# Patient Record
Sex: Female | Born: 1960
Health system: Southern US, Community
[De-identification: ages and names within clinical notes are randomized; demographics above are authoritative.]

## PROBLEM LIST (undated history)

## (undated) DIAGNOSIS — I341 Nonrheumatic mitral (valve) prolapse: Secondary | ICD-10-CM

## (undated) DIAGNOSIS — I10 Essential (primary) hypertension: Secondary | ICD-10-CM

## (undated) DIAGNOSIS — M810 Age-related osteoporosis without current pathological fracture: Secondary | ICD-10-CM

## (undated) HISTORY — PX: BREAST BIOPSY: SHX20

## (undated) HISTORY — DX: Age-related osteoporosis without current pathological fracture: M81.0

## (undated) HISTORY — PX: REDUCTION MAMMAPLASTY: SUR839

---

## 2000-05-13 ENCOUNTER — Ambulatory Visit (HOSPITAL_COMMUNITY): Admission: RE | Admit: 2000-05-13 | Discharge: 2000-05-13 | Payer: Self-pay | Admitting: Gastroenterology

## 2001-09-08 ENCOUNTER — Encounter: Payer: Self-pay | Admitting: *Deleted

## 2001-09-08 ENCOUNTER — Encounter: Admission: RE | Admit: 2001-09-08 | Discharge: 2001-09-08 | Payer: Self-pay | Admitting: *Deleted

## 2003-04-04 ENCOUNTER — Encounter: Payer: Self-pay | Admitting: Family Medicine

## 2003-04-04 ENCOUNTER — Ambulatory Visit (HOSPITAL_COMMUNITY): Admission: RE | Admit: 2003-04-04 | Discharge: 2003-04-04 | Payer: Self-pay | Admitting: Family Medicine

## 2004-04-23 ENCOUNTER — Other Ambulatory Visit: Admission: RE | Admit: 2004-04-23 | Discharge: 2004-04-23 | Payer: Self-pay | Admitting: Family Medicine

## 2004-04-29 ENCOUNTER — Ambulatory Visit (HOSPITAL_COMMUNITY): Admission: RE | Admit: 2004-04-29 | Discharge: 2004-04-29 | Payer: Self-pay | Admitting: Family Medicine

## 2005-04-24 ENCOUNTER — Other Ambulatory Visit: Admission: RE | Admit: 2005-04-24 | Discharge: 2005-04-24 | Payer: Self-pay | Admitting: Family Medicine

## 2005-05-19 ENCOUNTER — Ambulatory Visit (HOSPITAL_COMMUNITY): Admission: RE | Admit: 2005-05-19 | Discharge: 2005-05-19 | Payer: Self-pay | Admitting: Family Medicine

## 2005-11-30 ENCOUNTER — Ambulatory Visit (HOSPITAL_COMMUNITY): Admission: RE | Admit: 2005-11-30 | Discharge: 2005-11-30 | Payer: Self-pay | Admitting: Gastroenterology

## 2005-11-30 ENCOUNTER — Encounter (INDEPENDENT_AMBULATORY_CARE_PROVIDER_SITE_OTHER): Payer: Self-pay | Admitting: Specialist

## 2006-06-16 ENCOUNTER — Other Ambulatory Visit: Admission: RE | Admit: 2006-06-16 | Discharge: 2006-06-16 | Payer: Self-pay | Admitting: Family Medicine

## 2006-09-09 ENCOUNTER — Ambulatory Visit (HOSPITAL_COMMUNITY): Admission: RE | Admit: 2006-09-09 | Discharge: 2006-09-09 | Payer: Self-pay | Admitting: Family Medicine

## 2007-07-26 ENCOUNTER — Other Ambulatory Visit: Admission: RE | Admit: 2007-07-26 | Discharge: 2007-07-26 | Payer: Self-pay | Admitting: Family Medicine

## 2007-09-12 ENCOUNTER — Encounter: Admission: RE | Admit: 2007-09-12 | Discharge: 2007-09-12 | Payer: Self-pay | Admitting: Family Medicine

## 2008-07-27 ENCOUNTER — Other Ambulatory Visit: Admission: RE | Admit: 2008-07-27 | Discharge: 2008-07-27 | Payer: Self-pay | Admitting: Family Medicine

## 2008-12-05 ENCOUNTER — Ambulatory Visit (HOSPITAL_COMMUNITY): Admission: RE | Admit: 2008-12-05 | Discharge: 2008-12-05 | Payer: Self-pay | Admitting: Family Medicine

## 2009-07-29 ENCOUNTER — Other Ambulatory Visit: Admission: RE | Admit: 2009-07-29 | Discharge: 2009-07-29 | Payer: Self-pay | Admitting: Family Medicine

## 2010-01-01 ENCOUNTER — Ambulatory Visit (HOSPITAL_COMMUNITY): Admission: RE | Admit: 2010-01-01 | Discharge: 2010-01-01 | Payer: Self-pay | Admitting: Family Medicine

## 2010-11-23 ENCOUNTER — Encounter: Payer: Self-pay | Admitting: Family Medicine

## 2011-01-20 ENCOUNTER — Other Ambulatory Visit: Payer: Self-pay | Admitting: Gastroenterology

## 2011-03-20 NOTE — Op Note (Signed)
NAMEMAKAYAH, [name]                   ACCOUNT NO.:  [id number]   MEDICAL RECORD NO.:  [id number]          PATIENT TYPE:  AMB   LOCATION:  ENDO                         FACILITY:  [facility]   PHYSICIAN:  Bernette Redbird, M.D.   DATE OF BIRTH:  Jun 29, 1961   DATE OF PROCEDURE:  11/30/2005  DATE OF DISCHARGE:                                 OPERATIVE REPORT   PROCEDURE:  Colonoscopy with biopsies.   INDICATIONS:  A 50 year old female with a family history of colon cancer in  her father and also in an uncle. She had a negative colonoscopy by me  approximately 5 1/2 years ago.   FINDINGS:  Normal exam to the terminal ileum.   DESCRIPTION OF PROCEDURE:  The nature, purpose, and risks of the procedure  are familiar to the patient from prior examination and she provided written  consent. Sedation was fentanyl 75 mcg and Versed 8 mg IV without arrhythmias  or desaturation. The Olympus adjustable tension pediatric video colonoscope  was advanced with mild difficulty through a somewhat fixated and angulated  sigmoid region, with advancement made easier by placing the patient in the  supine position and using some irrigation with water to help open up the  sigmoid region. The scope was then advanced to the terminal ileum which had  a normal appearance and pullback was then performed. The quality of the prep  was very good and it is felt that all areas are well seen.   This was a normal examination. No polyps, cancer, colitis, vascular  malformations or diverticulosis were noted. Retroflexion in the rectum was  normal.   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   Retroflexion the rectum and reinspection of the rectum were unremarkable.   IMPRESSION:  1.  Altered bowel habit without obvious source on current examination      (787.99)  2.  Family  history of colon cancer.   PLAN:  Await pathology results. Repeat colonoscopy in 5 years in view of the  family history.           ______________________________  Bernette Redbird, M.D.     RB/MEDQ  D:  11/30/2005  T:  11/30/2005  Job:  [id number]   cc:   Jethro Bastos, M.D.  Fax: [phone]

## 2011-03-20 NOTE — Procedures (Signed)
[place]  Patient:    [name], [name]                          MRN: [id number] Proc. Date: 05/13/00 Adm. Date:  [id number] Attending:  Rich Brave                           Procedure Report  PROCEDURE:  Colonoscopy.  INDICATIONS:  A 50 year old with family history of colon cancer, rare episodes of small volume hematochezia, and occasional small volume fecal incontinence with urgency of defecation.  FINDINGS:  Normal examination to the cecum.  DESCRIPTION OF PROCEDURE:  The nature, purpose, and risks of the procedure have been discussed with the patient who provided written consent.  Sedation was fentanyl 75 mcg and Versed 7 mg IV without arrhythmias or desaturation. The Olympus pediatric adjustable tension video colonoscope was advanced easily to the cecum as identified by visualization of the ileocecal valve and the appendiceal orifice in the absence of further lumen.  Pullback was then performed.  The quality of the prep was excellent and it is felt that all areas were well seen.  This was a normal examination.  There was no evidence of polyps, cancer, colitis, vascular malformations, or diverticular disease and retroflexion in the rectum was normal.  There were mild internal hemorrhoids seen during pullout through the anal canal.  No biopsies were obtained.  The patient tolerated the procedure well and there were no apparent complications.  IMPRESSION:  Normal colonoscopy.  It is presumed that the patients occasional small volume hematochezia comes from her internal hemorrhoids.  PLAN:  Consider flexible sigmoidoscopy in five years and full colonoscopy in 10 years in view of the family history of colon cancer. DD:  05/13/00 TD:  05/13/00 Job: [id number]

## 2011-03-23 ENCOUNTER — Other Ambulatory Visit (HOSPITAL_COMMUNITY): Payer: Self-pay | Admitting: Internal Medicine

## 2011-03-23 DIAGNOSIS — Z1231 Encounter for screening mammogram for malignant neoplasm of breast: Secondary | ICD-10-CM

## 2011-03-27 ENCOUNTER — Ambulatory Visit (HOSPITAL_COMMUNITY)
Admission: RE | Admit: 2011-03-27 | Discharge: 2011-03-27 | Disposition: A | Discharge status: Home / Self Care | Payer: BC Managed Care – PPO | Source: Ambulatory Visit | Attending: Internal Medicine | Admitting: Internal Medicine

## 2011-03-27 DIAGNOSIS — Z1231 Encounter for screening mammogram for malignant neoplasm of breast: Secondary | ICD-10-CM | POA: Insufficient documentation

## 2012-02-01 ENCOUNTER — Other Ambulatory Visit: Payer: Self-pay | Admitting: Internal Medicine

## 2012-02-01 ENCOUNTER — Other Ambulatory Visit (HOSPITAL_COMMUNITY)
Admission: RE | Admit: 2012-02-01 | Discharge: 2012-02-01 | Disposition: A | Discharge status: Home / Self Care | Payer: BC Managed Care – PPO | Source: Ambulatory Visit | Attending: Internal Medicine | Admitting: Internal Medicine

## 2012-02-01 DIAGNOSIS — Z1159 Encounter for screening for other viral diseases: Secondary | ICD-10-CM | POA: Insufficient documentation

## 2012-02-01 DIAGNOSIS — Z01419 Encounter for gynecological examination (general) (routine) without abnormal findings: Secondary | ICD-10-CM | POA: Insufficient documentation

## 2012-03-02 ENCOUNTER — Other Ambulatory Visit (HOSPITAL_COMMUNITY): Payer: Self-pay | Admitting: Internal Medicine

## 2012-03-02 DIAGNOSIS — Z1231 Encounter for screening mammogram for malignant neoplasm of breast: Secondary | ICD-10-CM

## 2012-03-31 ENCOUNTER — Ambulatory Visit (HOSPITAL_COMMUNITY): Payer: BC Managed Care – PPO

## 2012-06-06 ENCOUNTER — Ambulatory Visit (HOSPITAL_COMMUNITY)
Admission: RE | Admit: 2012-06-06 | Discharge: 2012-06-06 | Disposition: A | Discharge status: Home / Self Care | Payer: BC Managed Care – PPO | Source: Ambulatory Visit | Attending: Internal Medicine | Admitting: Internal Medicine

## 2012-06-06 DIAGNOSIS — Z1231 Encounter for screening mammogram for malignant neoplasm of breast: Secondary | ICD-10-CM

## 2013-06-06 ENCOUNTER — Other Ambulatory Visit (HOSPITAL_COMMUNITY): Payer: Self-pay | Admitting: Internal Medicine

## 2013-06-06 DIAGNOSIS — Z1231 Encounter for screening mammogram for malignant neoplasm of breast: Secondary | ICD-10-CM

## 2013-06-09 ENCOUNTER — Ambulatory Visit (HOSPITAL_COMMUNITY)
Admission: RE | Admit: 2013-06-09 | Discharge: 2013-06-09 | Disposition: A | Discharge status: Home / Self Care | Payer: BC Managed Care – PPO | Source: Ambulatory Visit | Attending: Internal Medicine | Admitting: Internal Medicine

## 2013-06-09 DIAGNOSIS — Z1231 Encounter for screening mammogram for malignant neoplasm of breast: Secondary | ICD-10-CM

## 2014-01-06 ENCOUNTER — Emergency Department (HOSPITAL_COMMUNITY): Payer: BC Managed Care – PPO

## 2014-01-06 ENCOUNTER — Encounter (HOSPITAL_COMMUNITY): Payer: Self-pay | Admitting: Anesthesiology

## 2014-01-06 ENCOUNTER — Inpatient Hospital Stay (HOSPITAL_COMMUNITY): Payer: BC Managed Care – PPO

## 2014-01-06 ENCOUNTER — Inpatient Hospital Stay (HOSPITAL_COMMUNITY)
Admission: EM | Admit: 2014-01-06 | Discharge: 2014-01-10 | DRG: 183 | Disposition: A | Discharge status: Home / Self Care | Payer: BC Managed Care – PPO | Attending: General Surgery | Admitting: General Surgery

## 2014-01-06 DIAGNOSIS — S42109A Fracture of unspecified part of scapula, unspecified shoulder, initial encounter for closed fracture: Secondary | ICD-10-CM | POA: Diagnosis present

## 2014-01-06 DIAGNOSIS — S42101A Fracture of unspecified part of scapula, right shoulder, initial encounter for closed fracture: Secondary | ICD-10-CM | POA: Diagnosis present

## 2014-01-06 DIAGNOSIS — S81812A Laceration without foreign body, left lower leg, initial encounter: Secondary | ICD-10-CM | POA: Diagnosis present

## 2014-01-06 DIAGNOSIS — S22009A Unspecified fracture of unspecified thoracic vertebra, initial encounter for closed fracture: Secondary | ICD-10-CM | POA: Diagnosis present

## 2014-01-06 DIAGNOSIS — F329 Major depressive disorder, single episode, unspecified: Secondary | ICD-10-CM | POA: Diagnosis present

## 2014-01-06 DIAGNOSIS — I10 Essential (primary) hypertension: Secondary | ICD-10-CM | POA: Diagnosis present

## 2014-01-06 DIAGNOSIS — F3289 Other specified depressive episodes: Secondary | ICD-10-CM | POA: Diagnosis present

## 2014-01-06 DIAGNOSIS — I059 Rheumatic mitral valve disease, unspecified: Secondary | ICD-10-CM | POA: Diagnosis present

## 2014-01-06 DIAGNOSIS — S81009A Unspecified open wound, unspecified knee, initial encounter: Secondary | ICD-10-CM | POA: Diagnosis present

## 2014-01-06 DIAGNOSIS — T797XXA Traumatic subcutaneous emphysema, initial encounter: Secondary | ICD-10-CM | POA: Diagnosis present

## 2014-01-06 DIAGNOSIS — S272XXA Traumatic hemopneumothorax, initial encounter: Secondary | ICD-10-CM | POA: Diagnosis present

## 2014-01-06 DIAGNOSIS — S81811A Laceration without foreign body, right lower leg, initial encounter: Secondary | ICD-10-CM | POA: Diagnosis present

## 2014-01-06 DIAGNOSIS — D62 Acute posthemorrhagic anemia: Secondary | ICD-10-CM | POA: Diagnosis not present

## 2014-01-06 DIAGNOSIS — S2249XA Multiple fractures of ribs, unspecified side, initial encounter for closed fracture: Secondary | ICD-10-CM | POA: Diagnosis present

## 2014-01-06 DIAGNOSIS — S270XXA Traumatic pneumothorax, initial encounter: Secondary | ICD-10-CM

## 2014-01-06 DIAGNOSIS — S81809A Unspecified open wound, unspecified lower leg, initial encounter: Secondary | ICD-10-CM

## 2014-01-06 DIAGNOSIS — S91009A Unspecified open wound, unspecified ankle, initial encounter: Secondary | ICD-10-CM | POA: Diagnosis present

## 2014-01-06 DIAGNOSIS — J939 Pneumothorax, unspecified: Secondary | ICD-10-CM

## 2014-01-06 HISTORY — PX: CHEST TUBE INSERTION: SHX231

## 2014-01-06 HISTORY — DX: Essential (primary) hypertension: I10

## 2014-01-06 HISTORY — DX: Nonrheumatic mitral (valve) prolapse: I34.1

## 2014-01-06 LAB — CDS SEROLOGY

## 2014-01-06 LAB — CBC
HEMATOCRIT: 39.4 % (ref 36.0–46.0)
Hemoglobin: 13.9 g/dL (ref 12.0–15.0)
MCH: 30.8 pg (ref 26.0–34.0)
MCHC: 35.3 g/dL (ref 30.0–36.0)
MCV: 87.2 fL (ref 78.0–100.0)
Platelets: 272 10*3/uL (ref 150–400)
RBC: 4.52 MIL/uL (ref 3.87–5.11)
RDW: 13.2 % (ref 11.5–15.5)
WBC: 8.3 10*3/uL (ref 4.0–10.5)

## 2014-01-06 LAB — COMPREHENSIVE METABOLIC PANEL
ALT: 18 U/L (ref 0–35)
AST: 28 U/L (ref 0–37)
Albumin: 4 g/dL (ref 3.5–5.2)
Alkaline Phosphatase: 90 U/L (ref 39–117)
BUN: 17 mg/dL (ref 6–23)
CHLORIDE: 94 meq/L — AB (ref 96–112)
CO2: 22 mEq/L (ref 19–32)
Calcium: 9.2 mg/dL (ref 8.4–10.5)
Creatinine, Ser: 0.59 mg/dL (ref 0.50–1.10)
GFR calc non Af Amer: >90 mL/min (ref >90)
GLUCOSE: 123 mg/dL — AB (ref 70–99)
POTASSIUM: 3.3 meq/L — AB (ref 3.7–5.3)
SODIUM: 133 meq/L — AB (ref 137–147)
Total Bilirubin: 0.3 mg/dL (ref 0.3–1.2)
Total Protein: 7.2 g/dL (ref 6.0–8.3)

## 2014-01-06 LAB — SAMPLE TO BLOOD BANK

## 2014-01-06 LAB — PROTIME-INR
INR: 0.92 (ref 0.00–1.49)
Prothrombin Time: 12.2 seconds (ref 11.6–15.2)

## 2014-01-06 MED ORDER — HYDROMORPHONE HCL PF 1 MG/ML IJ SOLN
0.5000 mg | INTRAMUSCULAR | Status: DC | PRN
  Administered 2014-01-06 – 2014-01-07 (×3): 0.5 mg via INTRAVENOUS
  Filled 2014-01-06 (×3): qty 1

## 2014-01-06 MED ORDER — HYDROMORPHONE HCL PF 1 MG/ML IJ SOLN
1.0000 mg | Freq: Once | INTRAMUSCULAR | Status: AC
  Administered 2014-01-06: 1 mg via INTRAVENOUS
  Filled 2014-01-06: qty 1

## 2014-01-06 MED ORDER — PANTOPRAZOLE SODIUM 40 MG PO TBEC: 40.0000 mg | DELAYED_RELEASE_TABLET | Freq: Every day | ORAL | Status: DC

## 2014-01-06 MED ORDER — ENOXAPARIN SODIUM 40 MG/0.4ML ~~LOC~~ SOLN
40.0000 mg | SUBCUTANEOUS | Status: DC
  Administered 2014-01-06 – 2014-01-09 (×4): 40 mg via SUBCUTANEOUS
  Filled 2014-01-06 (×6): qty 0.4

## 2014-01-06 MED ORDER — ONDANSETRON HCL 4 MG PO TABS
4.0000 mg | ORAL_TABLET | Freq: Four times a day (QID) | ORAL | Status: DC | PRN
  Administered 2014-01-08: 4 mg via ORAL
  Filled 2014-01-06: qty 1

## 2014-01-06 MED ORDER — PANTOPRAZOLE SODIUM 40 MG IV SOLR
40.0000 mg | Freq: Every day | INTRAVENOUS | Status: DC
  Administered 2014-01-07: 40 mg via INTRAVENOUS
  Filled 2014-01-06 (×2): qty 40

## 2014-01-06 MED ORDER — OXYCODONE HCL 5 MG PO TABS: 10.0000 mg | ORAL_TABLET | ORAL | Status: DC | PRN

## 2014-01-06 MED ORDER — FENTANYL CITRATE 0.05 MG/ML IJ SOLN
INTRAMUSCULAR | Status: AC
  Administered 2014-01-06: 50 ug via INTRAVENOUS
  Filled 2014-01-06: qty 2

## 2014-01-06 MED ORDER — SODIUM CHLORIDE 0.9 % IV SOLN
1000.0000 mL | INTRAVENOUS | Status: DC
  Administered 2014-01-06: 1000 mL via INTRAVENOUS

## 2014-01-06 MED ORDER — IOHEXOL 300 MG/ML  SOLN
100.0000 mL | Freq: Once | INTRAMUSCULAR | Status: AC | PRN
  Administered 2014-01-06: 100 mL via INTRAVENOUS

## 2014-01-06 MED ORDER — KCL IN DEXTROSE-NACL 20-5-0.9 MEQ/L-%-% IV SOLN
INTRAVENOUS | Status: DC
  Administered 2014-01-06 – 2014-01-08 (×3): via INTRAVENOUS
  Filled 2014-01-06 (×6): qty 1000

## 2014-01-06 MED ORDER — FENTANYL CITRATE 0.05 MG/ML IJ SOLN
50.0000 ug | Freq: Once | INTRAMUSCULAR | Status: AC
  Administered 2014-01-06: 50 ug via INTRAVENOUS
  Filled 2014-01-06: qty 2

## 2014-01-06 MED ORDER — TRIAMTERENE-HCTZ 37.5-25 MG PO TABS
1.0000 | ORAL_TABLET | Freq: Every day | ORAL | Status: DC
  Administered 2014-01-08 – 2014-01-10 (×3): 1 via ORAL
  Filled 2014-01-06 (×5): qty 1

## 2014-01-06 MED ORDER — FLUOXETINE HCL 10 MG PO CAPS
10.0000 mg | ORAL_CAPSULE | Freq: Every day | ORAL | Status: DC
  Administered 2014-01-08 – 2014-01-10 (×3): 10 mg via ORAL
  Filled 2014-01-06 (×5): qty 1

## 2014-01-06 MED ORDER — ONDANSETRON HCL 4 MG/2ML IJ SOLN
4.0000 mg | Freq: Four times a day (QID) | INTRAMUSCULAR | Status: DC | PRN
  Administered 2014-01-07 (×3): 4 mg via INTRAVENOUS
  Filled 2014-01-06 (×3): qty 2

## 2014-01-06 MED ORDER — BISACODYL 10 MG RE SUPP: 10.0000 mg | Freq: Every day | RECTAL | Status: DC | PRN

## 2014-01-06 MED ORDER — FLUOXETINE HCL 20 MG PO TABS
10.0000 mg | ORAL_TABLET | Freq: Every day | ORAL | Status: DC
  Filled 2014-01-06: qty 1

## 2014-01-06 MED ORDER — MIDAZOLAM HCL 2 MG/2ML IJ SOLN
2.0000 mg | Freq: Once | INTRAMUSCULAR | Status: AC
  Administered 2014-01-06: 2 mg via INTRAVENOUS
  Filled 2014-01-06: qty 2

## 2014-01-06 MED ORDER — SODIUM CHLORIDE 0.9 % IV SOLN
1000.0000 mL | Freq: Once | INTRAVENOUS | Status: AC
  Administered 2014-01-06: 1000 mL via INTRAVENOUS

## 2014-01-06 MED ORDER — HYDROMORPHONE HCL PF 1 MG/ML IJ SOLN
1.0000 mg | Freq: Once | INTRAMUSCULAR | Status: AC
  Administered 2014-01-06: 1 mg via INTRAVENOUS

## 2014-01-06 MED ORDER — DOCUSATE SODIUM 100 MG PO CAPS
100.0000 mg | ORAL_CAPSULE | Freq: Two times a day (BID) | ORAL | Status: DC
  Administered 2014-01-07 – 2014-01-10 (×6): 100 mg via ORAL
  Filled 2014-01-06 (×6): qty 1

## 2014-01-06 MED ORDER — FENTANYL CITRATE 0.05 MG/ML IJ SOLN
50.0000 ug | Freq: Once | INTRAMUSCULAR | Status: AC
  Administered 2014-01-06: 50 ug via INTRAVENOUS

## 2014-01-06 MED ORDER — OXYCODONE HCL 5 MG PO TABS
5.0000 mg | ORAL_TABLET | ORAL | Status: DC | PRN
  Filled 2014-01-06: qty 1

## 2014-01-06 MED ORDER — HYDROCODONE-ACETAMINOPHEN 5-325 MG PO TABS: 1.0000 | ORAL_TABLET | ORAL | Status: DC | PRN
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
## 2014-01-06 NOTE — Procedures (Signed)
Chest Tube Insertion Procedure Note  Indications:  Clinically significant Pneumothorax/Traumatic  Pre-operative Diagnosis: Pneumothorax  Post-operative Diagnosis: Pneumothorax  Procedure Details  Informed consent was obtained for the procedure, including sedation.  Risks of lung perforation, hemorrhage, arrhythmia, and adverse drug reaction were discussed.   After sterile skin prep, using standard technique, a 32 French tube was placed in the right lateral 6th rib space.  Findings: Escape of air and leakage of 50ccblood  Estimated Blood Loss:  less than 50 mL         Specimens:  None              Complications:  None; patient tolerated the procedure well.         Disposition: Remained stable in the trauma resuscitation room         Condition: stable  Attending Attestation: I performed the procedure.

## 2014-01-06 NOTE — ED Notes (Signed)
Dr. Colbert CoyerArrieta contacted regarding comment to check HCG prior to CT- on order he marked yes, however patient is post-menopausal and no HCG has been ordered. Per Dr. Colbert CoyerArrieta, we do not need to wait for HCG if patient is post-menopause.
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
## 2014-01-07 ENCOUNTER — Inpatient Hospital Stay (HOSPITAL_COMMUNITY): Payer: BC Managed Care – PPO

## 2014-01-07 DIAGNOSIS — S22009A Unspecified fracture of unspecified thoracic vertebra, initial encounter for closed fracture: Secondary | ICD-10-CM

## 2014-01-07 LAB — CBC
HCT: 33.2 % — ABNORMAL LOW (ref 36.0–46.0)
Hemoglobin: 11.6 g/dL — ABNORMAL LOW (ref 12.0–15.0)
MCH: 30.4 pg (ref 26.0–34.0)
MCHC: 34.9 g/dL (ref 30.0–36.0)
MCV: 87.1 fL (ref 78.0–100.0)
PLATELETS: 218 10*3/uL (ref 150–400)
RBC: 3.81 MIL/uL — ABNORMAL LOW (ref 3.87–5.11)
RDW: 13.3 % (ref 11.5–15.5)
WBC: 9.6 10*3/uL (ref 4.0–10.5)

## 2014-01-07 LAB — BASIC METABOLIC PANEL
BUN: 11 mg/dL (ref 6–23)
CO2: 25 mEq/L (ref 19–32)
Calcium: 8.6 mg/dL (ref 8.4–10.5)
Chloride: 95 mEq/L — ABNORMAL LOW (ref 96–112)
Creatinine, Ser: 0.46 mg/dL — ABNORMAL LOW (ref 0.50–1.10)
Glucose, Bld: 169 mg/dL — ABNORMAL HIGH (ref 70–99)
POTASSIUM: 3.9 meq/L (ref 3.7–5.3)
SODIUM: 132 meq/L — AB (ref 137–147)

## 2014-01-07 LAB — MRSA PCR SCREENING: MRSA by PCR: NEGATIVE

## 2014-01-07 MED ORDER — HYDROMORPHONE HCL PF 1 MG/ML IJ SOLN
0.5000 mg | INTRAMUSCULAR | Status: DC | PRN
  Administered 2014-01-07 – 2014-01-08 (×4): 1 mg via INTRAVENOUS
  Filled 2014-01-07 (×2): qty 1
  Filled 2014-01-07: qty 2
  Filled 2014-01-07: qty 1

## 2014-01-07 MED ORDER — KETOROLAC TROMETHAMINE 30 MG/ML IJ SOLN
30.0000 mg | Freq: Three times a day (TID) | INTRAMUSCULAR | Status: DC
  Administered 2014-01-07 – 2014-01-08 (×3): 30 mg via INTRAVENOUS
  Filled 2014-01-07 (×6): qty 1

## 2014-01-07 MED ORDER — PROMETHAZINE HCL 25 MG/ML IJ SOLN
12.5000 mg | Freq: Four times a day (QID) | INTRAMUSCULAR | Status: DC | PRN
  Administered 2014-01-07: 12.5 mg via INTRAVENOUS
  Filled 2014-01-07: qty 1

## 2014-01-07 MED ORDER — ACETAMINOPHEN 10 MG/ML IV SOLN
1000.0000 mg | Freq: Four times a day (QID) | INTRAVENOUS | Status: AC
  Administered 2014-01-07 – 2014-01-08 (×4): 1000 mg via INTRAVENOUS
  Filled 2014-01-07 (×6): qty 100

## 2014-01-07 MED ORDER — GABAPENTIN 100 MG PO CAPS
100.0000 mg | ORAL_CAPSULE | Freq: Three times a day (TID) | ORAL | Status: DC
  Administered 2014-01-07: 100 mg via ORAL
  Filled 2014-01-07 (×5): qty 1
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
## 2014-01-08 ENCOUNTER — Inpatient Hospital Stay (HOSPITAL_COMMUNITY): Payer: BC Managed Care – PPO

## 2014-01-08 DIAGNOSIS — S81812A Laceration without foreign body, left lower leg, initial encounter: Secondary | ICD-10-CM | POA: Diagnosis present

## 2014-01-08 DIAGNOSIS — D62 Acute posthemorrhagic anemia: Secondary | ICD-10-CM

## 2014-01-08 DIAGNOSIS — S81811A Laceration without foreign body, right lower leg, initial encounter: Secondary | ICD-10-CM | POA: Diagnosis present

## 2014-01-08 DIAGNOSIS — S272XXA Traumatic hemopneumothorax, initial encounter: Secondary | ICD-10-CM | POA: Diagnosis present

## 2014-01-08 DIAGNOSIS — S81809A Unspecified open wound, unspecified lower leg, initial encounter: Secondary | ICD-10-CM

## 2014-01-08 DIAGNOSIS — F32A Depression, unspecified: Secondary | ICD-10-CM | POA: Insufficient documentation

## 2014-01-08 DIAGNOSIS — S81009A Unspecified open wound, unspecified knee, initial encounter: Secondary | ICD-10-CM

## 2014-01-08 DIAGNOSIS — F329 Major depressive disorder, single episode, unspecified: Secondary | ICD-10-CM | POA: Insufficient documentation

## 2014-01-08 DIAGNOSIS — S91009A Unspecified open wound, unspecified ankle, initial encounter: Secondary | ICD-10-CM

## 2014-01-08 DIAGNOSIS — I10 Essential (primary) hypertension: Secondary | ICD-10-CM | POA: Insufficient documentation

## 2014-01-08 DIAGNOSIS — S42101A Fracture of unspecified part of scapula, right shoulder, initial encounter for closed fracture: Secondary | ICD-10-CM | POA: Diagnosis present

## 2014-01-08 MED ORDER — BACITRACIN ZINC 500 UNIT/GM EX OINT
TOPICAL_OINTMENT | Freq: Two times a day (BID) | CUTANEOUS | Status: DC
  Administered 2014-01-08 – 2014-01-09 (×2): via TOPICAL
  Filled 2014-01-08: qty 15

## 2014-01-08 MED ORDER — HYDROCODONE-ACETAMINOPHEN 10-325 MG PO TABS
0.5000 | ORAL_TABLET | ORAL | Status: DC | PRN
  Administered 2014-01-08 – 2014-01-10 (×9): 2 via ORAL
  Administered 2014-01-10: 1 via ORAL
  Administered 2014-01-10: 2 via ORAL
  Filled 2014-01-08 (×11): qty 2

## 2014-01-08 MED ORDER — KETOROLAC TROMETHAMINE 15 MG/ML IJ SOLN
15.0000 mg | Freq: Four times a day (QID) | INTRAMUSCULAR | Status: DC | PRN
  Administered 2014-01-08 – 2014-01-10 (×4): 15 mg via INTRAVENOUS
  Filled 2014-01-08 (×4): qty 1

## 2014-01-08 MED ORDER — TRAMADOL HCL 50 MG PO TABS
50.0000 mg | ORAL_TABLET | Freq: Four times a day (QID) | ORAL | Status: DC | PRN
  Administered 2014-01-08: 100 mg via ORAL
  Filled 2014-01-08: qty 2

## 2014-01-08 MED ORDER — ASPIRIN EC 81 MG PO TBEC
81.0000 mg | DELAYED_RELEASE_TABLET | Freq: Every day | ORAL | Status: DC
  Administered 2014-01-08 – 2014-01-10 (×3): 81 mg via ORAL
  Filled 2014-01-08 (×3): qty 1
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
## 2014-01-08 NOTE — Progress Notes (Signed)
OT Cancellation Note  Patient Details Name: [name] MRN: [id number] DOB: [birth year]   Cancelled Treatment:    Reason Eval/Treat Not Completed:  (discontinuation order achknowledged. If OT needed, please reorder) Thanks  Cesc LLCWARD,HILLARY Sage Hammill, OTR/L  [phone] 01/08/2014 01/08/2014, 4:22 PM

## 2014-01-08 NOTE — Progress Notes (Signed)
Report called to Alona BeneJoyce, receiving RN on  6 north. VSS. Transferred to [id number] via wheelchair with personal belongings.   [facility] Nailani Full,RN

## 2014-01-08 NOTE — Progress Notes (Signed)
UR completed.  Bebe Moncure, RN BSN M[facility] CCM Trauma/Neuro ICU Case Manager [phone]  

## 2014-01-08 NOTE — Progress Notes (Signed)
Breath sounds are good.  Will likely get CT out Wednesday.  This patient has been seen and I agree with the findings and treatment plan.  Marta LamasJames O. Gae BonWyatt, III, MD, FACS [phone] (pager) [phone] (direct pager) Trauma Surgeon
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
## 2014-01-09 ENCOUNTER — Encounter (HOSPITAL_COMMUNITY): Payer: Self-pay | Admitting: General Practice

## 2014-01-09 ENCOUNTER — Inpatient Hospital Stay (HOSPITAL_COMMUNITY): Payer: BC Managed Care – PPO

## 2014-01-09 MED ORDER — IBUPROFEN 400 MG PO TABS
400.0000 mg | ORAL_TABLET | Freq: Four times a day (QID) | ORAL | Status: DC | PRN
  Administered 2014-01-09: 400 mg via ORAL
  Filled 2014-01-09: qty 1

## 2014-01-09 MED ORDER — POLYETHYLENE GLYCOL 3350 17 G PO PACK
17.0000 g | PACK | Freq: Every day | ORAL | Status: DC
  Administered 2014-01-09 – 2014-01-10 (×2): 17 g via ORAL
  Filled 2014-01-09 (×2): qty 1

## 2014-01-09 MED ORDER — MAGNESIUM HYDROXIDE 400 MG/5ML PO SUSP
15.0000 mL | Freq: Once | ORAL | Status: AC
  Administered 2014-01-09: 15 mL via ORAL
  Filled 2014-01-09: qty 30

## 2014-01-09 NOTE — Progress Notes (Signed)
Added Advil if she would rather take that instead of Toradol dose in preparation for D/C. H2O seal CT. Dr. Carola FrostHandy to evaluate scapula FX. Patient examined and I agree with the assessment and plan  Violeta GelinasBurke Patience Nuzzo, MD, MPH, FACS Trauma: [phone] General Surgery: [phone]  01/09/2014 1:00 PM
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
## 2014-01-10 ENCOUNTER — Inpatient Hospital Stay (HOSPITAL_COMMUNITY): Payer: BC Managed Care – PPO

## 2014-01-10 MED ORDER — IBUPROFEN 400 MG PO TABS: 800.0000 mg | ORAL_TABLET | Freq: Three times a day (TID) | ORAL | Status: DC

## 2014-01-10 MED ORDER — HYDROCODONE-ACETAMINOPHEN 10-325 MG PO TABS: 1.0000 | ORAL_TABLET | ORAL | Status: DC | PRN

## 2014-01-10 NOTE — Progress Notes (Signed)
CXR pending. If OK will D/C this PM. Patient examined and I agree with the assessment and plan  Violeta GelinasBurke Kerah Hardebeck, MD, MPH, FACS Trauma: [phone] General Surgery: [phone]  01/10/2014 11:06 AM
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
## 2014-01-10 NOTE — Progress Notes (Signed)
Patient discharged to home with instructions. 

## 2014-01-10 NOTE — Discharge Instructions (Signed)
Leave chest dressing in place until Friday.  For other wounds and for chest wound after Friday: Wash wounds daily in shower with soap and water. Do not soak. Apply antibiotic ointment (e.g. Neosporin) twice daily and as needed to keep moist. Cover with dry dressing.  No driving while taking hydrocodone.

## 2014-01-12 ENCOUNTER — Encounter (INDEPENDENT_AMBULATORY_CARE_PROVIDER_SITE_OTHER): Payer: Self-pay | Admitting: *Deleted

## 2014-01-12 ENCOUNTER — Telehealth (INDEPENDENT_AMBULATORY_CARE_PROVIDER_SITE_OTHER): Payer: Self-pay | Admitting: *Deleted

## 2014-01-12 NOTE — Telephone Encounter (Signed)
Sent work note per Charma IgoMichael Jeffery

## 2014-01-14 NOTE — Consult Note (Signed)
I have reviewed and discussed in detail with [name] the patient's presentation, examination findings, and I formulated the plan outlined above.  Virgel Haro, MD [facility] [phone] (p)   
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
## 2014-01-15 NOTE — Discharge Summary (Signed)
Cletus Paris, MD, MPH, FACS Trauma: [phone] General Surgery: [phone]  

## 2014-01-17 ENCOUNTER — Ambulatory Visit (INDEPENDENT_AMBULATORY_CARE_PROVIDER_SITE_OTHER): Payer: BC Managed Care – PPO | Admitting: Orthopedic Surgery

## 2014-01-17 ENCOUNTER — Encounter (INDEPENDENT_AMBULATORY_CARE_PROVIDER_SITE_OTHER): Payer: Self-pay

## 2014-01-17 VITALS — BP 122/70 | HR 62 | Temp 98.0°F | Resp 18 | Ht 66.5 in | Wt 131.0 lb

## 2014-01-17 DIAGNOSIS — S81812A Laceration without foreign body, left lower leg, initial encounter: Secondary | ICD-10-CM

## 2014-01-17 DIAGNOSIS — S42109A Fracture of unspecified part of scapula, unspecified shoulder, initial encounter for closed fracture: Secondary | ICD-10-CM

## 2014-01-17 DIAGNOSIS — S81811A Laceration without foreign body, right lower leg, initial encounter: Secondary | ICD-10-CM

## 2014-01-17 DIAGNOSIS — S91009A Unspecified open wound, unspecified ankle, initial encounter: Secondary | ICD-10-CM

## 2014-01-17 DIAGNOSIS — S81809A Unspecified open wound, unspecified lower leg, initial encounter: Secondary | ICD-10-CM

## 2014-01-17 DIAGNOSIS — S42101A Fracture of unspecified part of scapula, right shoulder, initial encounter for closed fracture: Secondary | ICD-10-CM

## 2014-01-17 DIAGNOSIS — S2249XA Multiple fractures of ribs, unspecified side, initial encounter for closed fracture: Secondary | ICD-10-CM

## 2014-01-17 DIAGNOSIS — S272XXA Traumatic hemopneumothorax, initial encounter: Secondary | ICD-10-CM

## 2014-01-17 DIAGNOSIS — S81009A Unspecified open wound, unspecified knee, initial encounter: Secondary | ICD-10-CM

## 2014-01-17 MED ORDER — HYDROCODONE-ACETAMINOPHEN 10-325 MG PO TABS: 1.0000 | ORAL_TABLET | ORAL | Status: DC | PRN

## 2014-01-17 NOTE — Patient Instructions (Signed)
Wash wounds daily in shower with soap and water. Apply antibiotic ointment (e.g. Neosporin) twice daily and as needed to keep moist. Cover with dry dressing. Steri-strips will come off on their own.  No driving while taking hydrocodone.

## 2014-01-17 NOTE — Progress Notes (Signed)
Subjective [name] comes in 10d s/p BCC where she suffered multiple rib fxs with HTPX s/p CT and some lower extremity lacerations. She's been doing well since discharge. Denies SOB or problems with her wounds. She had noted her left calf has been bothering her.   Objective Gen: WDWN in NAD Lungs: CTAB, chest tube site WNL Ext: Right calf wound healing well but wound edges not completely approximated. Sutures removed without difficulty and steri-strips applied. Left calf moderately ecchymotic and soft. TTP just distal to the popliteal fossa. No palpable cord-like structures. Laceration well-healed under Dermabond covering. Right calf was 30.5cm, left calf was 32cm.   Assessment & Plan S/p BCC Multiple rib fxs -- Gave her a rx for Norco 10/325, #20. Right calf lac -- Local care Left calf pain -- Discussed possibility of DVT though she is low risk. Offered US to r/o but she elected to give it time and see how it did. I suspect the size differential just relates to the increased ecchymosis of the calf. D-dimer would be unhelpful given her recent trauma. - Homan's sign. Right scap fx -- Provided rx for PT  F/u prn.    Freeman CaldronMichael J. [place]endale Youngblood, PA-C Pager: [phone] General Trauma PA Pager: [phone]

## 2014-05-10 ENCOUNTER — Other Ambulatory Visit (HOSPITAL_COMMUNITY): Payer: Self-pay | Admitting: Internal Medicine

## 2014-05-10 DIAGNOSIS — Z1231 Encounter for screening mammogram for malignant neoplasm of breast: Secondary | ICD-10-CM

## 2014-06-11 ENCOUNTER — Ambulatory Visit (HOSPITAL_COMMUNITY)
Admission: RE | Admit: 2014-06-11 | Discharge: 2014-06-11 | Disposition: A | Discharge status: Home / Self Care | Payer: BC Managed Care – PPO | Source: Ambulatory Visit | Attending: Internal Medicine | Admitting: Internal Medicine

## 2014-06-11 DIAGNOSIS — Z1231 Encounter for screening mammogram for malignant neoplasm of breast: Secondary | ICD-10-CM | POA: Insufficient documentation

## 2014-08-22 ENCOUNTER — Other Ambulatory Visit: Payer: Self-pay | Admitting: Gastroenterology

## 2015-02-11 ENCOUNTER — Other Ambulatory Visit: Payer: Self-pay | Admitting: Internal Medicine

## 2015-02-11 ENCOUNTER — Other Ambulatory Visit (HOSPITAL_COMMUNITY)
Admission: RE | Admit: 2015-02-11 | Discharge: 2015-02-11 | Disposition: A | Discharge status: Home / Self Care | Payer: BLUE CROSS/BLUE SHIELD | Source: Ambulatory Visit | Attending: Internal Medicine | Admitting: Internal Medicine

## 2015-02-11 DIAGNOSIS — Z01419 Encounter for gynecological examination (general) (routine) without abnormal findings: Secondary | ICD-10-CM | POA: Diagnosis present

## 2015-02-11 DIAGNOSIS — Z1151 Encounter for screening for human papillomavirus (HPV): Secondary | ICD-10-CM | POA: Diagnosis present

## 2015-02-12 LAB — CYTOLOGY - PAP

## 2015-06-19 ENCOUNTER — Other Ambulatory Visit (HOSPITAL_COMMUNITY): Payer: Self-pay | Admitting: Internal Medicine

## 2015-06-19 DIAGNOSIS — Z1231 Encounter for screening mammogram for malignant neoplasm of breast: Secondary | ICD-10-CM

## 2015-06-26 ENCOUNTER — Ambulatory Visit (HOSPITAL_COMMUNITY)
Admission: RE | Admit: 2015-06-26 | Discharge: 2015-06-26 | Disposition: A | Discharge status: Home / Self Care | Payer: BLUE CROSS/BLUE SHIELD | Source: Ambulatory Visit | Attending: Internal Medicine | Admitting: Internal Medicine

## 2015-06-26 DIAGNOSIS — Z1231 Encounter for screening mammogram for malignant neoplasm of breast: Secondary | ICD-10-CM | POA: Diagnosis not present

## 2016-07-31 ENCOUNTER — Other Ambulatory Visit: Payer: Self-pay | Admitting: Internal Medicine

## 2016-07-31 DIAGNOSIS — Z1231 Encounter for screening mammogram for malignant neoplasm of breast: Secondary | ICD-10-CM

## 2016-08-04 ENCOUNTER — Ambulatory Visit
Admission: RE | Admit: 2016-08-04 | Discharge: 2016-08-04 | Disposition: A | Discharge status: Home / Self Care | Payer: BLUE CROSS/BLUE SHIELD | Source: Ambulatory Visit | Attending: Internal Medicine | Admitting: Internal Medicine

## 2016-08-04 DIAGNOSIS — Z1231 Encounter for screening mammogram for malignant neoplasm of breast: Secondary | ICD-10-CM | POA: Diagnosis not present

## 2016-09-03 DIAGNOSIS — Z23 Encounter for immunization: Secondary | ICD-10-CM | POA: Diagnosis not present

## 2017-02-17 DIAGNOSIS — Z0000 Encounter for general adult medical examination without abnormal findings: Secondary | ICD-10-CM | POA: Diagnosis not present

## 2017-02-17 DIAGNOSIS — I10 Essential (primary) hypertension: Secondary | ICD-10-CM | POA: Diagnosis not present

## 2017-05-03 ENCOUNTER — Ambulatory Visit (INDEPENDENT_AMBULATORY_CARE_PROVIDER_SITE_OTHER): Payer: BLUE CROSS/BLUE SHIELD | Admitting: Psychology

## 2017-05-03 DIAGNOSIS — F4323 Adjustment disorder with mixed anxiety and depressed mood: Secondary | ICD-10-CM | POA: Diagnosis not present

## 2017-05-26 ENCOUNTER — Ambulatory Visit (INDEPENDENT_AMBULATORY_CARE_PROVIDER_SITE_OTHER): Payer: BLUE CROSS/BLUE SHIELD | Admitting: Psychology

## 2017-05-26 DIAGNOSIS — F4323 Adjustment disorder with mixed anxiety and depressed mood: Secondary | ICD-10-CM | POA: Diagnosis not present

## 2017-06-03 DIAGNOSIS — Z01419 Encounter for gynecological examination (general) (routine) without abnormal findings: Secondary | ICD-10-CM | POA: Diagnosis not present

## 2017-06-16 DIAGNOSIS — K136 Irritative hyperplasia of oral mucosa: Secondary | ICD-10-CM | POA: Diagnosis not present

## 2017-06-17 ENCOUNTER — Ambulatory Visit: Payer: BLUE CROSS/BLUE SHIELD | Admitting: Psychology

## 2017-06-21 DIAGNOSIS — K136 Irritative hyperplasia of oral mucosa: Secondary | ICD-10-CM | POA: Diagnosis not present

## 2017-06-24 ENCOUNTER — Ambulatory Visit: Payer: BLUE CROSS/BLUE SHIELD | Admitting: Psychology

## 2017-07-01 ENCOUNTER — Ambulatory Visit: Payer: BLUE CROSS/BLUE SHIELD | Admitting: Psychology

## 2017-07-08 ENCOUNTER — Ambulatory Visit (INDEPENDENT_AMBULATORY_CARE_PROVIDER_SITE_OTHER): Payer: BLUE CROSS/BLUE SHIELD | Admitting: Psychology

## 2017-07-08 DIAGNOSIS — F4323 Adjustment disorder with mixed anxiety and depressed mood: Secondary | ICD-10-CM

## 2017-07-15 ENCOUNTER — Ambulatory Visit: Payer: BLUE CROSS/BLUE SHIELD | Admitting: Psychology

## 2017-08-31 DIAGNOSIS — Z1231 Encounter for screening mammogram for malignant neoplasm of breast: Secondary | ICD-10-CM | POA: Diagnosis not present

## 2018-02-23 DIAGNOSIS — I10 Essential (primary) hypertension: Secondary | ICD-10-CM | POA: Diagnosis not present

## 2018-02-23 DIAGNOSIS — Z0000 Encounter for general adult medical examination without abnormal findings: Secondary | ICD-10-CM | POA: Diagnosis not present

## 2018-02-24 MED FILL — SHINGRIX 50 MCG SUS: 50 | 1 days supply | Qty: 1 | Fill #0

## 2018-03-22 ENCOUNTER — Ambulatory Visit (INDEPENDENT_AMBULATORY_CARE_PROVIDER_SITE_OTHER): Payer: 59 | Admitting: Psychology

## 2018-03-22 DIAGNOSIS — F4323 Adjustment disorder with mixed anxiety and depressed mood: Secondary | ICD-10-CM | POA: Diagnosis not present

## 2018-03-22 DIAGNOSIS — R69 Illness, unspecified: Secondary | ICD-10-CM | POA: Diagnosis not present

## 2018-04-01 ENCOUNTER — Ambulatory Visit (INDEPENDENT_AMBULATORY_CARE_PROVIDER_SITE_OTHER): Payer: 59 | Admitting: Psychology

## 2018-04-01 DIAGNOSIS — R69 Illness, unspecified: Secondary | ICD-10-CM | POA: Diagnosis not present

## 2018-04-01 DIAGNOSIS — F4323 Adjustment disorder with mixed anxiety and depressed mood: Secondary | ICD-10-CM

## 2018-04-28 ENCOUNTER — Ambulatory Visit: Payer: 59 | Admitting: Psychology

## 2018-05-19 MED FILL — SHINGRIX 50 MCG SUS: 50 | 1 days supply | Qty: 1 | Fill #1

## 2018-05-20 ENCOUNTER — Ambulatory Visit (INDEPENDENT_AMBULATORY_CARE_PROVIDER_SITE_OTHER): Payer: 59 | Admitting: Psychology

## 2018-05-20 DIAGNOSIS — R69 Illness, unspecified: Secondary | ICD-10-CM | POA: Diagnosis not present

## 2018-05-20 DIAGNOSIS — F4323 Adjustment disorder with mixed anxiety and depressed mood: Secondary | ICD-10-CM | POA: Diagnosis not present

## 2018-05-27 ENCOUNTER — Ambulatory Visit (INDEPENDENT_AMBULATORY_CARE_PROVIDER_SITE_OTHER): Payer: 59 | Admitting: Psychology

## 2018-05-27 DIAGNOSIS — R69 Illness, unspecified: Secondary | ICD-10-CM | POA: Diagnosis not present

## 2018-05-27 DIAGNOSIS — F4323 Adjustment disorder with mixed anxiety and depressed mood: Secondary | ICD-10-CM

## 2018-06-02 DIAGNOSIS — S29011D Strain of muscle and tendon of front wall of thorax, subsequent encounter: Secondary | ICD-10-CM | POA: Diagnosis not present

## 2018-06-10 DIAGNOSIS — S29011D Strain of muscle and tendon of front wall of thorax, subsequent encounter: Secondary | ICD-10-CM | POA: Diagnosis not present

## 2018-06-27 DIAGNOSIS — S29011D Strain of muscle and tendon of front wall of thorax, subsequent encounter: Secondary | ICD-10-CM | POA: Diagnosis not present

## 2018-07-07 DIAGNOSIS — S29011D Strain of muscle and tendon of front wall of thorax, subsequent encounter: Secondary | ICD-10-CM | POA: Diagnosis not present

## 2018-07-20 DIAGNOSIS — S29011D Strain of muscle and tendon of front wall of thorax, subsequent encounter: Secondary | ICD-10-CM | POA: Diagnosis not present

## 2018-08-03 DIAGNOSIS — Z23 Encounter for immunization: Secondary | ICD-10-CM | POA: Diagnosis not present

## 2018-10-05 DIAGNOSIS — Z681 Body mass index (BMI) 19 or less, adult: Secondary | ICD-10-CM | POA: Diagnosis not present

## 2018-10-05 DIAGNOSIS — Z1231 Encounter for screening mammogram for malignant neoplasm of breast: Secondary | ICD-10-CM | POA: Diagnosis not present

## 2018-10-05 DIAGNOSIS — Z01419 Encounter for gynecological examination (general) (routine) without abnormal findings: Secondary | ICD-10-CM | POA: Diagnosis not present

## 2018-10-06 DIAGNOSIS — L821 Other seborrheic keratosis: Secondary | ICD-10-CM | POA: Diagnosis not present

## 2018-10-06 DIAGNOSIS — L718 Other rosacea: Secondary | ICD-10-CM | POA: Diagnosis not present

## 2018-10-06 DIAGNOSIS — D2371 Other benign neoplasm of skin of right lower limb, including hip: Secondary | ICD-10-CM | POA: Diagnosis not present

## 2018-11-30 DIAGNOSIS — N62 Hypertrophy of breast: Secondary | ICD-10-CM | POA: Diagnosis not present

## 2020-05-28 ENCOUNTER — Other Ambulatory Visit: Payer: Self-pay | Admitting: Plastic Surgery

## 2020-10-18 ENCOUNTER — Other Ambulatory Visit: Payer: Self-pay | Admitting: Internal Medicine

## 2020-10-18 DIAGNOSIS — R928 Other abnormal and inconclusive findings on diagnostic imaging of breast: Secondary | ICD-10-CM

## 2020-10-23 ENCOUNTER — Other Ambulatory Visit: Payer: Self-pay

## 2020-10-23 ENCOUNTER — Ambulatory Visit
Admission: RE | Admit: 2020-10-23 | Discharge: 2020-10-23 | Disposition: A | Discharge status: Home / Self Care | Payer: 59 | Source: Ambulatory Visit | Attending: Internal Medicine | Admitting: Internal Medicine

## 2020-10-23 DIAGNOSIS — R928 Other abnormal and inconclusive findings on diagnostic imaging of breast: Secondary | ICD-10-CM

## 2021-06-26 DIAGNOSIS — Z7185 Encounter for immunization safety counseling: Secondary | ICD-10-CM | POA: Diagnosis not present

## 2021-06-26 DIAGNOSIS — R0981 Nasal congestion: Secondary | ICD-10-CM | POA: Diagnosis not present

## 2021-06-26 DIAGNOSIS — H6123 Impacted cerumen, bilateral: Secondary | ICD-10-CM | POA: Diagnosis not present

## 2021-09-09 DIAGNOSIS — Z0000 Encounter for general adult medical examination without abnormal findings: Secondary | ICD-10-CM | POA: Diagnosis not present

## 2021-09-09 DIAGNOSIS — I10 Essential (primary) hypertension: Secondary | ICD-10-CM | POA: Diagnosis not present

## 2021-09-29 ENCOUNTER — Other Ambulatory Visit: Payer: Self-pay | Admitting: Internal Medicine

## 2021-09-29 DIAGNOSIS — Z1231 Encounter for screening mammogram for malignant neoplasm of breast: Secondary | ICD-10-CM

## 2021-11-06 ENCOUNTER — Ambulatory Visit
Admission: RE | Admit: 2021-11-06 | Discharge: 2021-11-06 | Disposition: A | Discharge status: Home / Self Care | Payer: BC Managed Care – PPO | Source: Ambulatory Visit | Attending: Internal Medicine | Admitting: Internal Medicine

## 2021-11-06 ENCOUNTER — Other Ambulatory Visit: Payer: Self-pay

## 2021-11-06 DIAGNOSIS — Z1231 Encounter for screening mammogram for malignant neoplasm of breast: Secondary | ICD-10-CM

## 2021-12-01 DIAGNOSIS — H43812 Vitreous degeneration, left eye: Secondary | ICD-10-CM | POA: Diagnosis not present

## 2022-01-14 DIAGNOSIS — G43809 Other migraine, not intractable, without status migrainosus: Secondary | ICD-10-CM | POA: Diagnosis not present

## 2022-01-14 DIAGNOSIS — H25012 Cortical age-related cataract, left eye: Secondary | ICD-10-CM | POA: Diagnosis not present

## 2022-01-14 DIAGNOSIS — H35033 Hypertensive retinopathy, bilateral: Secondary | ICD-10-CM | POA: Diagnosis not present

## 2022-01-14 DIAGNOSIS — H43812 Vitreous degeneration, left eye: Secondary | ICD-10-CM | POA: Diagnosis not present

## 2022-02-04 DIAGNOSIS — H5201 Hypermetropia, right eye: Secondary | ICD-10-CM | POA: Diagnosis not present

## 2022-02-04 DIAGNOSIS — H43812 Vitreous degeneration, left eye: Secondary | ICD-10-CM | POA: Diagnosis not present

## 2022-02-04 DIAGNOSIS — H25012 Cortical age-related cataract, left eye: Secondary | ICD-10-CM | POA: Diagnosis not present

## 2022-02-04 DIAGNOSIS — G43909 Migraine, unspecified, not intractable, without status migrainosus: Secondary | ICD-10-CM | POA: Diagnosis not present

## 2022-05-08 IMAGING — MG MM DIGITAL SCREENING BILAT W/ TOMO AND CAD
8 series · 9 of 24 positions shown · non-contrast
Comparison: Previous exam(s).

CLINICAL DATA: Screening.

EXAM:
DIGITAL SCREENING BILATERAL MAMMOGRAM WITH TOMOSYNTHESIS AND CAD
TECHNIQUE: Bilateral screening digital craniocaudal and mediolateral oblique
mammograms were obtained. Bilateral screening digital breast
tomosynthesis was performed. The images were evaluated with
computer-aided detection.

[L MLO synth-2D]
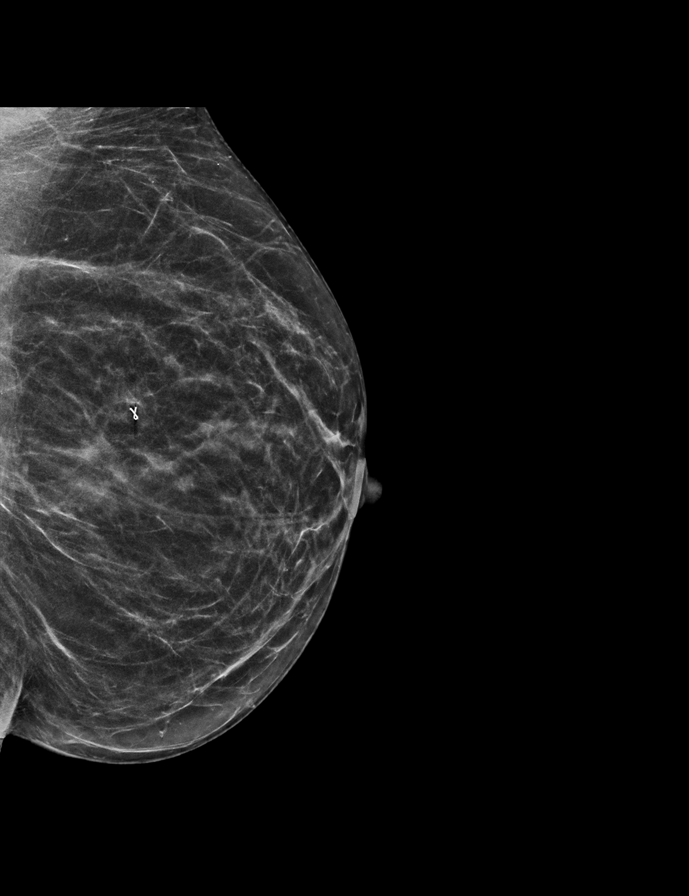

[R MLO synth-2D]
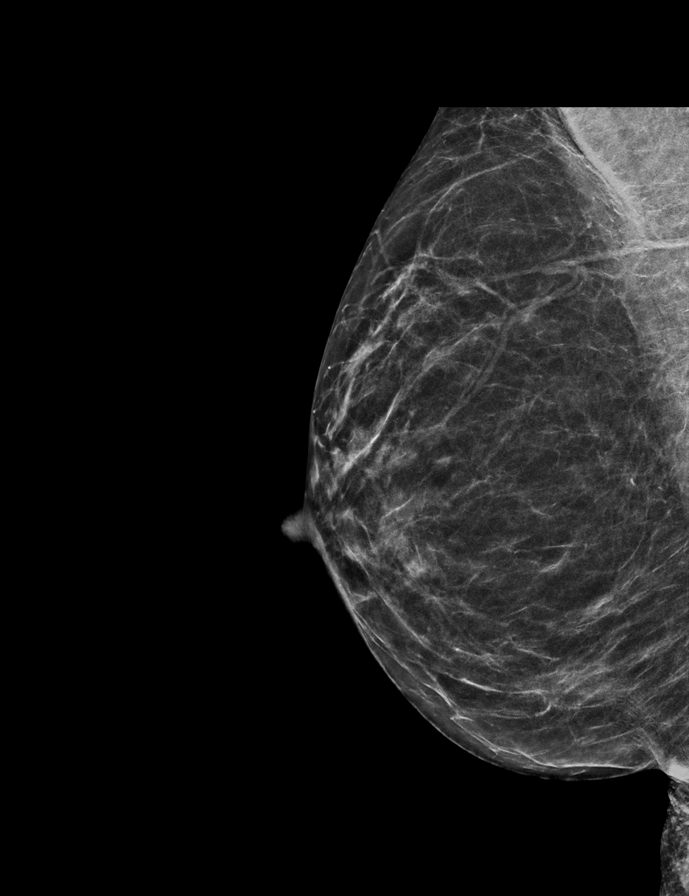

[L CC synth-2D]
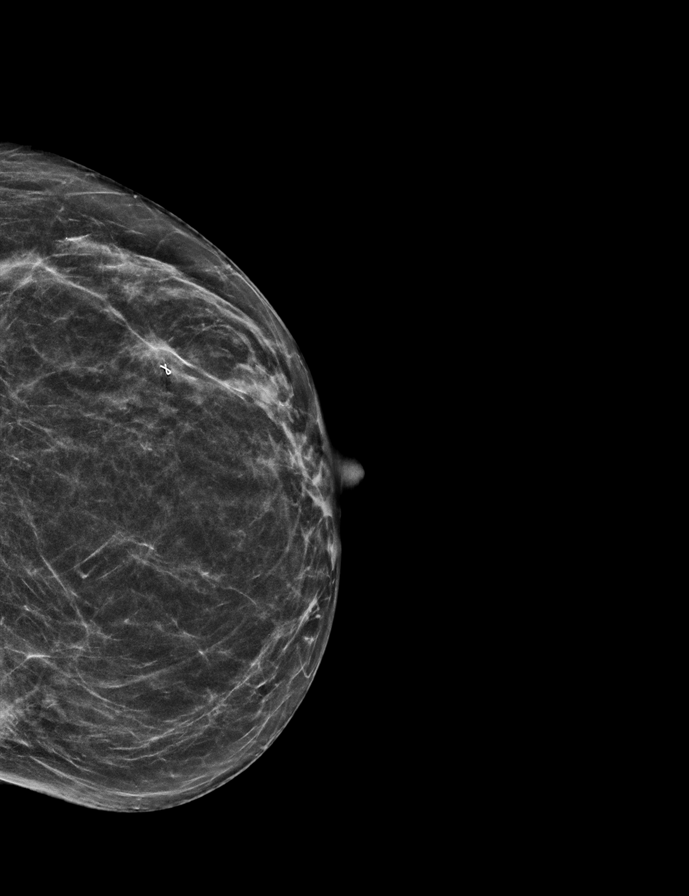

[R CC synth-2D]
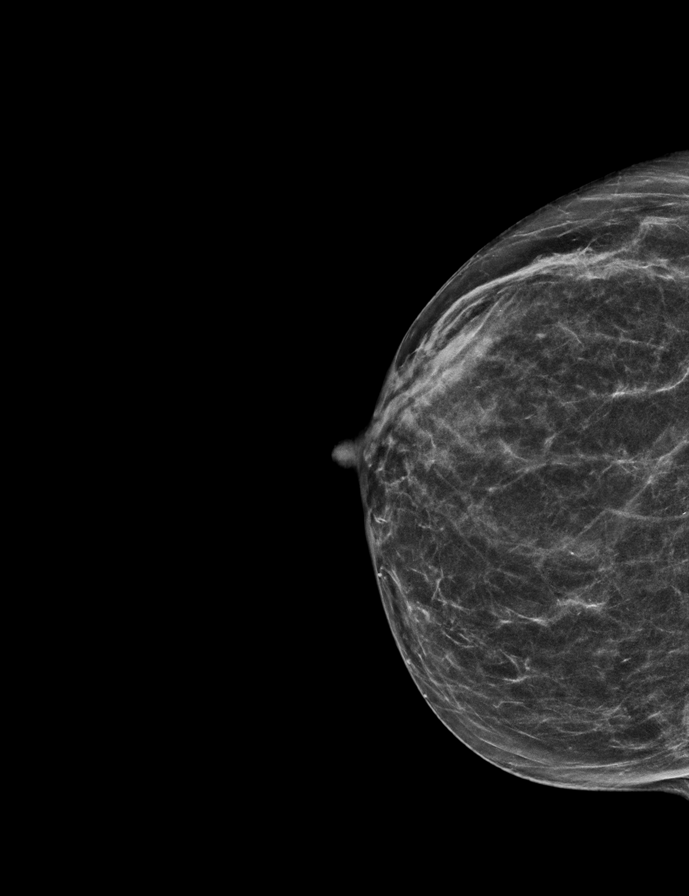

[R MLO tomo · 2 of 47 frames shown]
[frame 16/47]
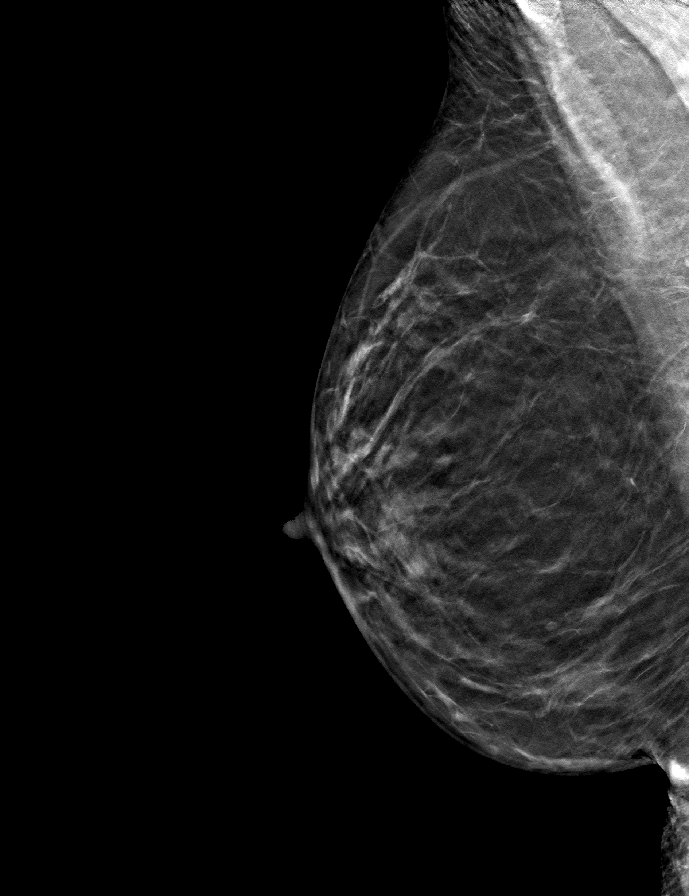
[frame 24/47]
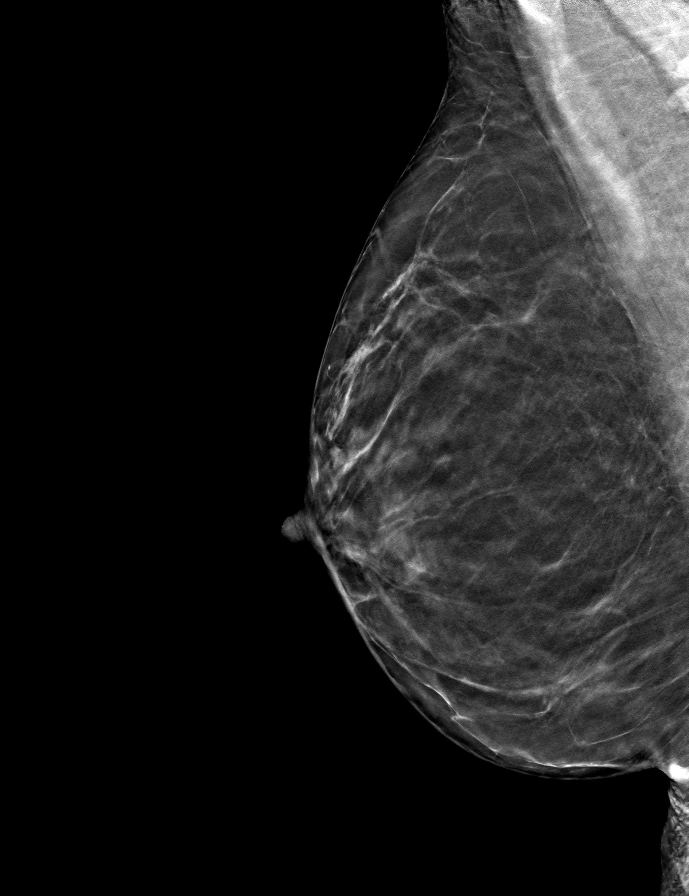

[R CC tomo · tomo slice 25/48.0]
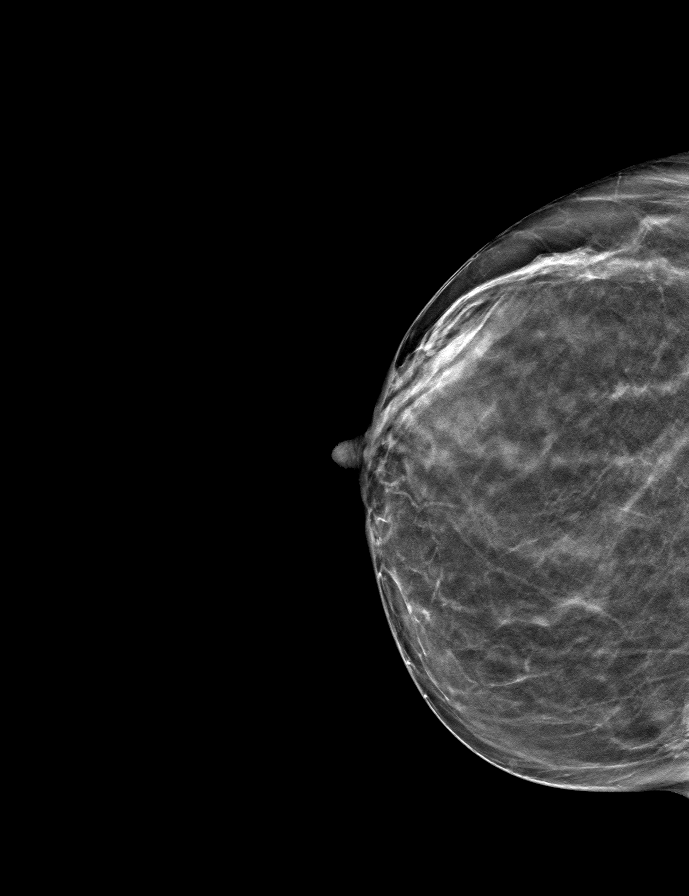

[L CC tomo · tomo slice 24/47.0]
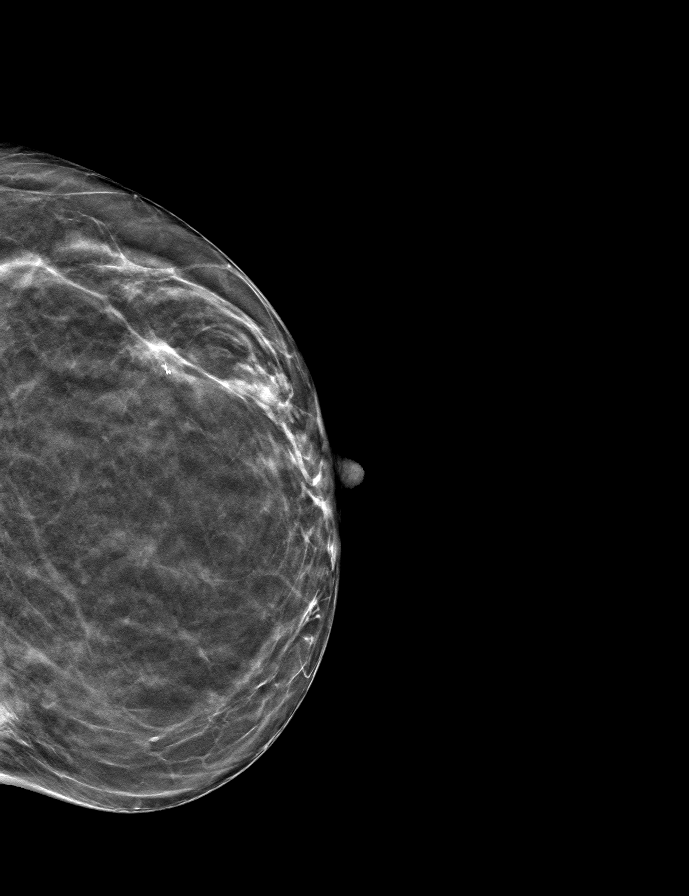

[L MLO tomo · tomo slice 25/49.0]
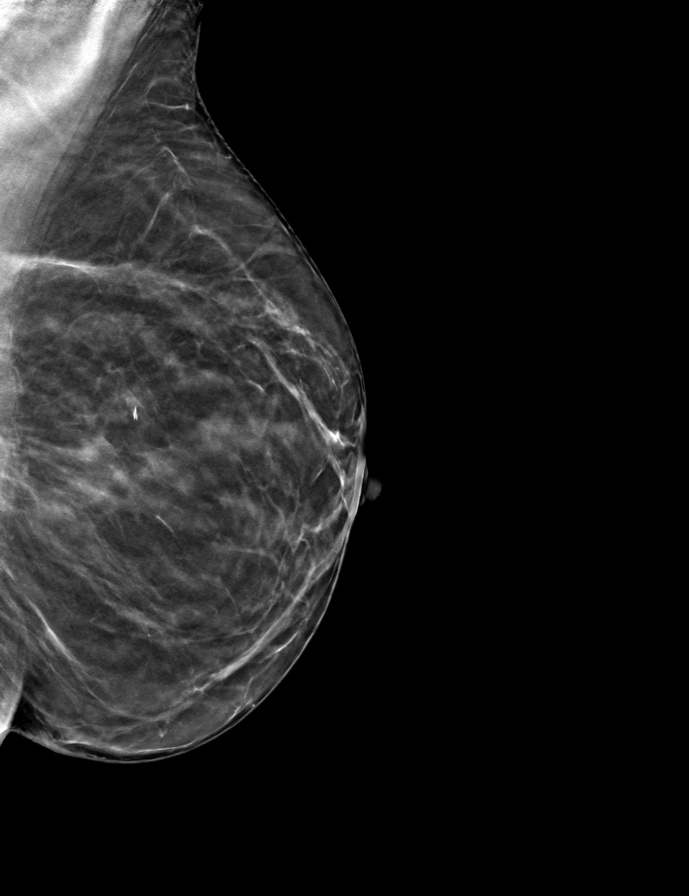

[9 of 24 positions shown; findings below may reference images not displayed]

ACR Breast Density Category b: There are scattered areas of
fibroglandular density.
FINDINGS: There are no findings suspicious for malignancy.
IMPRESSION: No mammographic evidence of malignancy. A result letter of this
screening mammogram will be mailed directly to the patient.

RECOMMENDATION:
Screening mammogram in one year. (Code:51-O-LD2)

BI-RADS CATEGORY  1: Negative.

## 2022-10-08 ENCOUNTER — Other Ambulatory Visit: Payer: Self-pay | Admitting: Internal Medicine

## 2022-10-08 DIAGNOSIS — Z1231 Encounter for screening mammogram for malignant neoplasm of breast: Secondary | ICD-10-CM

## 2022-11-04 DIAGNOSIS — J0100 Acute maxillary sinusitis, unspecified: Secondary | ICD-10-CM | POA: Diagnosis not present

## 2022-11-04 DIAGNOSIS — J039 Acute tonsillitis, unspecified: Secondary | ICD-10-CM | POA: Diagnosis not present

## 2022-11-10 ENCOUNTER — Other Ambulatory Visit: Payer: Self-pay | Admitting: Internal Medicine

## 2022-11-10 DIAGNOSIS — I10 Essential (primary) hypertension: Secondary | ICD-10-CM | POA: Diagnosis not present

## 2022-11-10 DIAGNOSIS — Z79899 Other long term (current) drug therapy: Secondary | ICD-10-CM | POA: Diagnosis not present

## 2022-11-10 DIAGNOSIS — E7800 Pure hypercholesterolemia, unspecified: Secondary | ICD-10-CM | POA: Diagnosis not present

## 2022-11-10 DIAGNOSIS — Z780 Asymptomatic menopausal state: Secondary | ICD-10-CM

## 2022-11-10 DIAGNOSIS — Z0000 Encounter for general adult medical examination without abnormal findings: Secondary | ICD-10-CM | POA: Diagnosis not present

## 2022-12-03 ENCOUNTER — Ambulatory Visit
Admission: RE | Admit: 2022-12-03 | Discharge: 2022-12-03 | Disposition: A | Discharge status: Home / Self Care | Payer: BC Managed Care – PPO | Source: Ambulatory Visit | Attending: Internal Medicine | Admitting: Internal Medicine

## 2022-12-03 DIAGNOSIS — Z1231 Encounter for screening mammogram for malignant neoplasm of breast: Secondary | ICD-10-CM | POA: Diagnosis not present

## 2023-01-21 DIAGNOSIS — L718 Other rosacea: Secondary | ICD-10-CM | POA: Diagnosis not present

## 2023-01-21 DIAGNOSIS — L738 Other specified follicular disorders: Secondary | ICD-10-CM | POA: Diagnosis not present

## 2023-01-21 DIAGNOSIS — L821 Other seborrheic keratosis: Secondary | ICD-10-CM | POA: Diagnosis not present

## 2023-01-21 DIAGNOSIS — D225 Melanocytic nevi of trunk: Secondary | ICD-10-CM | POA: Diagnosis not present

## 2023-04-07 DIAGNOSIS — H25813 Combined forms of age-related cataract, bilateral: Secondary | ICD-10-CM | POA: Diagnosis not present

## 2023-04-07 DIAGNOSIS — H52203 Unspecified astigmatism, bilateral: Secondary | ICD-10-CM | POA: Diagnosis not present

## 2023-04-07 DIAGNOSIS — H43813 Vitreous degeneration, bilateral: Secondary | ICD-10-CM | POA: Diagnosis not present

## 2023-04-20 DIAGNOSIS — I341 Nonrheumatic mitral (valve) prolapse: Secondary | ICD-10-CM | POA: Diagnosis not present

## 2023-04-20 DIAGNOSIS — R002 Palpitations: Secondary | ICD-10-CM | POA: Diagnosis not present

## 2023-04-23 ENCOUNTER — Ambulatory Visit
Admission: RE | Admit: 2023-04-23 | Discharge: 2023-04-23 | Disposition: A | Discharge status: Home / Self Care | Payer: BC Managed Care – PPO | Source: Ambulatory Visit | Attending: Internal Medicine | Admitting: Internal Medicine

## 2023-04-23 DIAGNOSIS — N958 Other specified menopausal and perimenopausal disorders: Secondary | ICD-10-CM | POA: Diagnosis not present

## 2023-04-23 DIAGNOSIS — Z780 Asymptomatic menopausal state: Secondary | ICD-10-CM

## 2023-04-23 DIAGNOSIS — I341 Nonrheumatic mitral (valve) prolapse: Secondary | ICD-10-CM | POA: Diagnosis not present

## 2023-04-23 DIAGNOSIS — E349 Endocrine disorder, unspecified: Secondary | ICD-10-CM | POA: Diagnosis not present

## 2023-04-30 DIAGNOSIS — K529 Noninfective gastroenteritis and colitis, unspecified: Secondary | ICD-10-CM | POA: Diagnosis not present

## 2023-04-30 DIAGNOSIS — M810 Age-related osteoporosis without current pathological fracture: Secondary | ICD-10-CM | POA: Diagnosis not present

## 2023-05-05 DIAGNOSIS — M810 Age-related osteoporosis without current pathological fracture: Secondary | ICD-10-CM | POA: Diagnosis not present

## 2023-05-07 DIAGNOSIS — R778 Other specified abnormalities of plasma proteins: Secondary | ICD-10-CM | POA: Diagnosis not present

## 2023-05-08 DIAGNOSIS — R002 Palpitations: Secondary | ICD-10-CM | POA: Diagnosis not present

## 2023-05-11 DIAGNOSIS — R002 Palpitations: Secondary | ICD-10-CM | POA: Diagnosis not present

## 2023-05-12 DIAGNOSIS — H6123 Impacted cerumen, bilateral: Secondary | ICD-10-CM | POA: Diagnosis not present

## 2023-05-17 ENCOUNTER — Ambulatory Visit: Payer: Self-pay | Admitting: Cardiology

## 2023-07-02 NOTE — Progress Notes (Unsigned)
[facility]   [phone] Fax:([phone]) [phone]   Clinic New Consult Note   Patient Care Team: Kirby Funk, MD (Inactive) as PCP - General (Internal Medicine) 07/03/2023  CHIEF COMPLAINTS/PURPOSE OF CONSULTATION:  Abnormal SPEP  REFERRING PHYSICIAN: Thana Ates, MD   HISTORY OF PRESENTING ILLNESS:  [name] 62 y.o. female is here because of abnormal SPEP.  She was referred by her primary care physician Kirby Funk, MD (Inactive).  He presents to the clinic by herself.  She was recently diagnosed with osteoporosis.  She denies any bone pain or other new symptoms.  She is very physically active, exercises regularly, including weightlifting.  She has no significant past medical history.  No recent weight loss, night sweats, or fever.  She does have significant family history of colon cancer, in her father, paternal uncle and a paternal cousin, who had her colon cancer in her 30s.  She has done to colonoscopy which were negative except a few polyps.   MEDICAL HISTORY:  Past Medical History:  Diagnosis Date   Hypertension    Mitral valve prolapse    Osteoporosis     SURGICAL HISTORY: Past Surgical History:  Procedure Laterality Date   BREAST BIOPSY Left    CHEST TUBE INSERTION  01/06/2014   REDUCTION MAMMAPLASTY Bilateral     SOCIAL HISTORY: Social History   Socioeconomic History   Marital status: Married    Spouse name: Not on file   Number of children: 0   Years of education: Not on file   Highest education level: Not on file  Occupational History   Not on file  Tobacco Use   Smoking status: Never   Smokeless tobacco: Never  Substance and Sexual Activity   Alcohol use: No   Drug use: No   Sexual activity: Not on file  Other Topics Concern   Not on file  Social History Narrative   Not on file   Social Determinants of Health   Financial Resource Strain: Not on file  Food Insecurity: No Food Insecurity (07/03/2023)   Hunger  Vital Sign    Worried About Running Out of Food in the Last Year: Never true    Ran Out of Food in the Last Year: Never true  Transportation Needs: No Transportation Needs (07/03/2023)   PRAPARE - Administrator, Civil Service (Medical): No    Lack of Transportation (Non-Medical): No  Physical Activity: Not on file  Stress: Not on file  Social Connections: Not on file  Intimate Partner Violence: Not At Risk (07/03/2023)   Humiliation, Afraid, Rape, and Kick questionnaire    Fear of Current or Ex-Partner: No    Emotionally Abused: No    Physically Abused: No    Sexually Abused: No    FAMILY HISTORY: Family History  Problem Relation Age of Onset   Cancer Father 43       COLON CANCER   Cancer Paternal Uncle 82       colon cancer   Cancer Cousin 35       colon cancer    ALLERGIES:  is allergic to doxycycline hyclate and lisinopril.  MEDICATIONS:  Current Outpatient Medications  Medication Sig Dispense Refill   doxycycline (MONODOX) 100 MG capsule Take 100 mg by mouth 2 (two) times daily.     hydrochlorothiazide (HYDRODIURIL) 12.5 MG tablet Take 12.5 mg by mouth daily.     telmisartan (MICARDIS) 80 MG tablet Take 80 mg by mouth daily.  No current facility-administered medications for this visit.    REVIEW OF SYSTEMS:   Constitutional: Denies fevers, chills or abnormal night sweats Eyes: Denies blurriness of vision, double vision or watery eyes Ears, nose, mouth, throat, and face: Denies mucositis or sore throat Respiratory: Denies cough, dyspnea or wheezes Cardiovascular: Denies palpitation, chest discomfort or lower extremity swelling Gastrointestinal:  Denies nausea, heartburn or change in bowel habits Skin: Denies abnormal skin rashes Lymphatics: Denies new lymphadenopathy or easy bruising Neurological:Denies numbness, tingling or new weaknesses Behavioral/Psych: Mood is stable, no new changes  All other systems were reviewed with the patient and are  negative.  PHYSICAL EXAMINATION: ECOG PERFORMANCE STATUS: 0 - Asymptomatic  Vitals:   07/03/23 0752  BP: (!) 141/87  Pulse: 78  Resp: 15  Temp: 98.1 F (36.7 C)  SpO2: 100%   There were no vitals filed for this visit.  GENERAL:alert, no distress and comfortable SKIN: skin color, texture, turgor are normal, no rashes or significant lesions EYES: normal, conjunctiva are pink and non-injected, sclera clear OROPHARYNX:no exudate, no erythema and lips, buccal mucosa, and tongue normal  NECK: supple, thyroid normal size, non-tender, without nodularity LYMPH:  no palpable lymphadenopathy in the cervical, axillary or inguinal LUNGS: clear to auscultation and percussion with normal breathing effort HEART: regular rate & rhythm and no murmurs and no lower extremity edema ABDOMEN:abdomen soft, non-tender and normal bowel sounds Musculoskeletal:no cyanosis of digits and no clubbing  PSYCH: alert & oriented x 3 with fluent speech NEURO: no focal motor/sensory deficits  LABORATORY DATA:  I have reviewed the data as listed    Latest Ref Rng & Units 01/07/2014    4:13 AM 01/06/2014    3:32 PM  CBC  WBC 4.0 - 10.5 K/uL 9.6  8.3   Hemoglobin 12.0 - 15.0 g/dL 95.2  84.1   Hematocrit 36.0 - 46.0 % 33.2  39.4   Platelets 150 - 400 K/uL 218  272     @cmpl @  RADIOGRAPHIC STUDIES: I have personally reviewed the radiological images as listed and agreed with the findings in the report. No results found.  ASSESSMENT & PLAN:  62 year old postmenopausal woman  MGUS (monoclonal gammopathy of unknown significance) -Incidental finding on her routine lab -She had M protein 0.4 on SPEP, with slightly increased IgA level and polyclonal increase detected in 1 or more immunoglobulins. -No lab evidence of renal dysfunction, abnormal calcium, or clinical symptoms of multiple myeloma. -No clinical suspicion for multiple myeloma. -Repeat SPEP with immunofixation, immunoglobulin quantitative, serum light  chain, and 24-hour urine for SPEP and light chain level. -Also order a bone survey -Given her relatively protein level, no other clinical high risk features, I do not think she needs a bone marrow biopsy. -I recommend monitor SPEP every 6 to 12 months.    Osteoporosis -Her bone density scan in June 2024 showed a diffuse osteoporosis, worst in AP spine with T-score -3.6 -She has no history of fracture. -Started oral calcium and vitamin D supplement, she also does weightbearing exercise -She will discuss with her PCP Dr. Valentina Lucks about biphosphonate. -Also asked question about estrogen replacement for osteoporosis.  We discussed the pros and cons, especially small risk of breast cancer.  She will discuss with Dr. Valentina Lucks to decide.  Family history of colon cancer -Her father, paternal uncle and a paternal cousin (in her 47's) had colon cancer -Will refer her to genetics -She had a colonoscopy twice.  Plan -lab in 2-3 weeks (she will leave for vacation for 2  weeks), under Dr. Derek Mound name per her request due to insurance coverage  -Bone survey  -phone visit in a month to review the result    All questions were answered. The patient knows to call the clinic with any problems, questions or concerns. I spent 25 minutes counseling the patient face to face. The total time spent in the appointment was 30 minutes and more than 50% was on counseling.     Malachy Mood, MD 07/03/2023 8:48 AM

## 2023-07-03 ENCOUNTER — Inpatient Hospital Stay: Payer: BC Managed Care – PPO

## 2023-07-03 ENCOUNTER — Encounter: Payer: Self-pay | Admitting: Hematology

## 2023-07-03 ENCOUNTER — Other Ambulatory Visit: Payer: Self-pay

## 2023-07-03 ENCOUNTER — Inpatient Hospital Stay: Payer: BC Managed Care – PPO | Admitting: Hematology

## 2023-07-03 ENCOUNTER — Inpatient Hospital Stay: Payer: BC Managed Care – PPO | Attending: Hematology | Admitting: Hematology

## 2023-07-03 ENCOUNTER — Other Ambulatory Visit: Payer: BC Managed Care – PPO

## 2023-07-03 VITALS — BP 141/87 | HR 78 | Temp 98.1°F | Resp 15

## 2023-07-03 DIAGNOSIS — M810 Age-related osteoporosis without current pathological fracture: Secondary | ICD-10-CM | POA: Diagnosis not present

## 2023-07-03 DIAGNOSIS — M816 Localized osteoporosis [Lequesne]: Secondary | ICD-10-CM

## 2023-07-03 DIAGNOSIS — D472 Monoclonal gammopathy: Secondary | ICD-10-CM

## 2023-07-03 DIAGNOSIS — Z800 Family history of malignant neoplasm of digestive organs: Secondary | ICD-10-CM

## 2023-07-03 NOTE — Assessment & Plan Note (Addendum)
-  Her father, paternal uncle and a paternal cousin (in her 72's) had colon cancer -Will refer her to genetics -She had a colonoscopy twice.

## 2023-07-03 NOTE — Addendum Note (Signed)
Addended by: Malachy Mood on: 07/03/2023 08:49 AM   Modules accepted: Orders

## 2023-07-03 NOTE — Assessment & Plan Note (Addendum)
-  Incidental finding on her routine lab -She had M protein 0.4 on SPEP, with slightly increased IgA level and polyclonal increase detected in 1 or more immunoglobulins. -No lab evidence of renal dysfunction, abnormal calcium, or clinical symptoms of multiple myeloma. -No clinical suspicion for multiple myeloma. -Repeat SPEP with immunofixation, immunoglobulin quantitative, serum light chain, and 24-hour urine for SPEP and light chain level. -Also order a bone survey -Given her relatively protein level, no other clinical high risk features, I do not think she needs a bone marrow biopsy. -I recommend monitor SPEP every 6 to 12 months.

## 2023-07-03 NOTE — Assessment & Plan Note (Signed)
-  Her bone density scan in June 2024 showed a diffuse osteoporosis, worst in AP spine with T-score -3.6 -She has no history of fracture. -Started oral calcium and vitamin D supplement, she also does weightbearing exercise -She will discuss with her PCP Dr. Valentina Lucks about biphosphonate. -Also asked question about estrogen replacement for osteoporosis.  We discussed the pros and cons, especially small risk of breast cancer.  She will discuss with Dr. Valentina Lucks to decide.

## 2023-07-12 ENCOUNTER — Ambulatory Visit: Payer: BC Managed Care – PPO | Admitting: Cardiovascular Disease

## 2023-07-15 ENCOUNTER — Telehealth: Payer: Self-pay

## 2023-07-15 NOTE — Telephone Encounter (Signed)
LVM stating that Dr. Latanya Maudlin office was notified by Central Scheduling that pt was questioning why Dr. Mosetta Putt ordered a Bone Survey for this pt.  Stated that Dr. Mosetta Putt ordered a bone survey per the pt's discussion with Dr. Mosetta Putt in clinic on 07/03/2023 to r/o lectic bone lesions.  Instructed pt to give Central Scheduling a call back to schedule the Bone Survey.  Instructed pt to contact Dr. Latanya Maudlin office should she have additional questions or concerns.

## 2023-07-15 NOTE — Telephone Encounter (Signed)
Pt LM stating she knows she has osteoporosis and she does not understand the need for this other scan.  LM for pt explaining the difference between the bone density and bone scan.  Reiterated the need for the scan and left the number to call to schedule

## 2023-07-19 ENCOUNTER — Telehealth: Payer: Self-pay | Admitting: Hematology

## 2023-07-19 ENCOUNTER — Inpatient Hospital Stay: Payer: BC Managed Care – PPO | Attending: Hematology

## 2023-07-19 ENCOUNTER — Telehealth: Payer: Self-pay | Admitting: *Deleted

## 2023-07-19 DIAGNOSIS — D472 Monoclonal gammopathy: Secondary | ICD-10-CM | POA: Diagnosis not present

## 2023-07-19 LAB — CMP (CANCER CENTER ONLY)
ALT: 14 U/L (ref 0–44)
AST: 21 U/L (ref 15–41)
Albumin: 4.4 g/dL (ref 3.5–5.0)
Alkaline Phosphatase: 70 U/L (ref 38–126)
Anion gap: 7 (ref 5–15)
BUN: 20 mg/dL (ref 8–23)
CO2: 29 mmol/L (ref 22–32)
Calcium: 9.4 mg/dL (ref 8.9–10.3)
Chloride: 91 mmol/L — ABNORMAL LOW (ref 98–111)
Creatinine: 0.74 mg/dL (ref 0.44–1.00)
GFR, Estimated: >60 mL/min (ref >60)
Glucose, Bld: 75 mg/dL (ref 70–99)
Potassium: 3.7 mmol/L (ref 3.5–5.1)
Sodium: 127 mmol/L — ABNORMAL LOW (ref 135–145)
Total Bilirubin: 0.7 mg/dL (ref 0.3–1.2)
Total Protein: 7.3 g/dL (ref 6.5–8.1)

## 2023-07-19 LAB — CBC WITH DIFFERENTIAL (CANCER CENTER ONLY)
Abs Immature Granulocytes: 0.04 10*3/uL (ref 0.00–0.07)
Basophils Absolute: 0.1 10*3/uL (ref 0.0–0.1)
Basophils Relative: 1 %
Eosinophils Absolute: 0.1 10*3/uL (ref 0.0–0.5)
Eosinophils Relative: 1 %
HCT: 39.9 % (ref 36.0–46.0)
Hemoglobin: 13.8 g/dL (ref 12.0–15.0)
Immature Granulocytes: 0 %
Lymphocytes Relative: 21 %
Lymphs Abs: 1.9 10*3/uL (ref 0.7–4.0)
MCH: 29.8 pg (ref 26.0–34.0)
MCHC: 34.6 g/dL (ref 30.0–36.0)
MCV: 86.2 fL (ref 80.0–100.0)
Monocytes Absolute: 0.6 10*3/uL (ref 0.1–1.0)
Monocytes Relative: 6 %
Neutro Abs: 6.5 10*3/uL (ref 1.7–7.7)
Neutrophils Relative %: 71 %
Platelet Count: 271 10*3/uL (ref 150–400)
RBC: 4.63 MIL/uL (ref 3.87–5.11)
RDW: 13.1 % (ref 11.5–15.5)
WBC Count: 9.1 10*3/uL (ref 4.0–10.5)
nRBC: 0 % (ref 0.0–0.2)

## 2023-07-19 NOTE — Telephone Encounter (Signed)
Canceled appointments per incoming call. Patient does not wish to reschedule appointments at this time.

## 2023-07-19 NOTE — Telephone Encounter (Signed)
Received call from [name] regarding which provider her insurance covers and which provider has ordered he rabs and scans. She states that Dr. Mosetta Putt is not covered under her insurance but Dr. Leonides Schanz is. Apparently she will be transferred over to Dr. Leonides Schanz. Advised to call to get her Bone Survey scheduled and then to call back so we can schedule her appt with Dr. Leonides Schanz.  [name] called back and has her Bone survey scheduled  for 07/26/23. Advised that we can schedule her appt with Dr. Leonides Schanz on 07/30/23 [name] voiced understanding. Advised to bring her 24 hour urine in on the morning of her bone survey. [name] voiced understanding.

## 2023-07-20 ENCOUNTER — Telehealth: Payer: Self-pay | Admitting: Hematology and Oncology

## 2023-07-20 LAB — KAPPA/LAMBDA LIGHT CHAINS
Kappa free light chain: 13.6 mg/L (ref 3.3–19.4)
Kappa, lambda light chain ratio: 1.05 (ref 0.26–1.65)
Lambda free light chains: 13 mg/L (ref 5.7–26.3)

## 2023-07-23 LAB — MULTIPLE MYELOMA PANEL, SERUM
Albumin SerPl Elph-Mcnc: 4.1 g/dL (ref 2.9–4.4)
Albumin/Glob SerPl: 1.4 (ref 0.7–1.7)
Alpha 1: 0.3 g/dL (ref 0.0–0.4)
Alpha2 Glob SerPl Elph-Mcnc: 0.7 g/dL (ref 0.4–1.0)
B-Globulin SerPl Elph-Mcnc: 1 g/dL (ref 0.7–1.3)
Gamma Glob SerPl Elph-Mcnc: 1.1 g/dL (ref 0.4–1.8)
Globulin, Total: 3 g/dL (ref 2.2–3.9)
IgA: 441 mg/dL — ABNORMAL HIGH (ref 87–352)
IgG (Immunoglobin G), Serum: 1037 mg/dL (ref 586–1602)
IgM (Immunoglobulin M), Srm: 95 mg/dL (ref 26–217)
Total Protein ELP: 7.1 g/dL (ref 6.0–8.5)

## 2023-07-26 ENCOUNTER — Other Ambulatory Visit: Payer: BC Managed Care – PPO

## 2023-07-26 ENCOUNTER — Ambulatory Visit (HOSPITAL_COMMUNITY)
Admission: RE | Admit: 2023-07-26 | Discharge: 2023-07-26 | Disposition: A | Discharge status: Home / Self Care | Payer: BC Managed Care – PPO | Source: Ambulatory Visit | Attending: Hematology and Oncology | Admitting: Hematology and Oncology

## 2023-07-26 ENCOUNTER — Other Ambulatory Visit: Payer: Self-pay

## 2023-07-26 ENCOUNTER — Encounter: Payer: BC Managed Care – PPO | Admitting: Genetic Counselor

## 2023-07-26 DIAGNOSIS — D472 Monoclonal gammopathy: Secondary | ICD-10-CM | POA: Diagnosis not present

## 2023-07-28 LAB — UPEP/UIFE/LIGHT CHAINS/TP, 24-HR UR
% BETA, Urine: 38.7 %
ALPHA 1 URINE: 4.6 %
Albumin, U: 27.5 %
Alpha 2, Urine: 11.5 %
Free Kappa Lt Chains,Ur: 7.58 mg/L (ref 1.17–86.46)
Free Kappa/Lambda Ratio: 5.61 (ref 1.83–14.26)
Free Lambda Lt Chains,Ur: 1.35 mg/L (ref 0.27–15.21)
GAMMA GLOBULIN URINE: 17.7 %
Total Protein, Urine-Ur/day: 168 mg/24 hr — ABNORMAL HIGH (ref 30–150)
Total Protein, Urine: 8.2 mg/dL
Total Volume: 2050

## 2023-07-29 DIAGNOSIS — N951 Menopausal and female climacteric states: Secondary | ICD-10-CM | POA: Diagnosis not present

## 2023-07-29 DIAGNOSIS — R748 Abnormal levels of other serum enzymes: Secondary | ICD-10-CM | POA: Diagnosis not present

## 2023-07-29 DIAGNOSIS — M810 Age-related osteoporosis without current pathological fracture: Secondary | ICD-10-CM | POA: Diagnosis not present

## 2023-07-29 DIAGNOSIS — Z131 Encounter for screening for diabetes mellitus: Secondary | ICD-10-CM | POA: Diagnosis not present

## 2023-07-29 DIAGNOSIS — E612 Magnesium deficiency: Secondary | ICD-10-CM | POA: Diagnosis not present

## 2023-07-29 DIAGNOSIS — E559 Vitamin D deficiency, unspecified: Secondary | ICD-10-CM | POA: Diagnosis not present

## 2023-07-29 DIAGNOSIS — I10 Essential (primary) hypertension: Secondary | ICD-10-CM | POA: Diagnosis not present

## 2023-07-29 DIAGNOSIS — D519 Vitamin B12 deficiency anemia, unspecified: Secondary | ICD-10-CM | POA: Diagnosis not present

## 2023-07-30 ENCOUNTER — Inpatient Hospital Stay (HOSPITAL_BASED_OUTPATIENT_CLINIC_OR_DEPARTMENT_OTHER): Payer: BC Managed Care – PPO | Admitting: Hematology and Oncology

## 2023-07-30 VITALS — BP 156/93 | HR 71 | Temp 98.1°F | Resp 14 | Wt 120.8 lb

## 2023-07-30 DIAGNOSIS — D472 Monoclonal gammopathy: Secondary | ICD-10-CM

## 2023-07-30 NOTE — Progress Notes (Signed)
[facility] Telephone:([phone]) [phone]   Fax:(336) [phone]  PROGRESS NOTE  Patient Care Team: Thana Ates, MD as PCP - General (Internal Medicine)  Hematological/Oncological History # Concern for Monoclonal Gammopathy  04/30/2023: M protein 0.4, Iga 445 (nml 87-352)  07/03/2023: establish care with Dr. Mosetta Putt. MM panel showed no M protein, SFLC kappa 13.6, lambda 13, ratio 1.05. UPEP negative for M protein 07/30/2023: transfer care to Dr. Leonides Schanz   Interval History:  [name] 62 y.o. 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 The patient's last visit was on 07/03/2023. In the interim since the last visit her MGUS labs returned with no clear evidence of an M protein.  On exam today [name] is accompanied by her husband.  She reports she has been well overall in the interim since her last visit.  The bulk of our discussion focused on the results of her blood work and the plan moving forward.  Details of this conversation are noted below.  MEDICAL HISTORY:  Past Medical History:  Diagnosis Date   Hypertension    Mitral valve prolapse    Osteoporosis     SURGICAL HISTORY: Past Surgical History:  Procedure Laterality Date   BREAST BIOPSY Left    CHEST TUBE INSERTION  01/06/2014   REDUCTION MAMMAPLASTY Bilateral     SOCIAL HISTORY: Social History   Socioeconomic History   Marital status: Married    Spouse name: Not on file   Number of children: 0   Years of education: Not on file   Highest education level: Not on file  Occupational History   Not on file  Tobacco Use   Smoking status: Never   Smokeless tobacco: Never  Substance and Sexual Activity   Alcohol use: No   Drug use: No   Sexual activity: Not on file  Other Topics Concern   Not on file  Social History Narrative   Not on file   Social Determinants of Health   Financial Resource Strain: Not on file  Food Insecurity: No Food Insecurity  (07/03/2023)   Hunger Vital Sign    Worried About Running Out of Food in the Last Year: Never true    Ran Out of Food in the Last Year: Never true  Transportation Needs: No Transportation Needs (07/03/2023)   PRAPARE - Administrator, Civil Service (Medical): No    Lack of Transportation (Non-Medical): No  Physical Activity: Not on file  Stress: Not on file  Social Connections: Not on file  Intimate Partner Violence: Not At Risk (07/03/2023)   Humiliation, Afraid, Rape, and Kick questionnaire    Fear of Current or Ex-Partner: No    Emotionally Abused: No    Physically Abused: No    Sexually Abused: No    FAMILY HISTORY: Family History  Problem Relation Age of Onset   Cancer Father 83       COLON CANCER   Cancer Paternal Uncle 57       colon cancer   Cancer Cousin 35       colon cancer    ALLERGIES:  is allergic to doxycycline hyclate and lisinopril.  MEDICATIONS:  Current Outpatient Medications  Medication Sig Dispense Refill   doxycycline (MONODOX) 100 MG capsule Take 100 mg by mouth 2 (two) times daily.     hydrochlorothiazide (HYDRODIURIL) 12.5 MG tablet Take 12.5 mg by mouth daily.     telmisartan (MICARDIS) 80 MG  tablet Take 80 mg by mouth daily.     No current facility-administered medications for this visit.    REVIEW OF SYSTEMS:   Constitutional: ( - ) fevers, ( - )  chills , ( - ) night sweats Eyes: ( - ) blurriness of vision, ( - ) double vision, ( - ) watery eyes Ears, nose, mouth, throat, and face: ( - ) mucositis, ( - ) sore throat Respiratory: ( - ) cough, ( - ) dyspnea, ( - ) wheezes Cardiovascular: ( - ) palpitation, ( - ) chest discomfort, ( - ) lower extremity swelling Gastrointestinal:  ( - ) nausea, ( - ) heartburn, ( - ) change in bowel habits Skin: ( - ) abnormal skin rashes Lymphatics: ( - ) new lymphadenopathy, ( - ) easy bruising Neurological: ( - ) numbness, ( - ) tingling, ( - ) new weaknesses Behavioral/Psych: ( - ) mood change,  ( - ) new changes  All other systems were reviewed with the patient and are negative.  PHYSICAL EXAMINATION: Vitals:   07/30/23 1333  BP: (!) 156/93  Pulse: 71  Resp: 14  Temp: 98.1 F (36.7 C)  SpO2: 100%   Filed Weights   07/30/23 1333  Weight: 120 lb 12.8 oz (54.8 kg)    GENERAL: Well-appearing middle-age Caucasian female, alert, no distress and comfortable SKIN: skin color, texture, turgor are normal, no rashes or significant lesions EYES: conjunctiva are pink and non-injected, sclera clear LUNGS: clear to auscultation and percussion with normal breathing effort HEART: regular rate & rhythm and no murmurs and no lower extremity edema Musculoskeletal: no cyanosis of digits and no clubbing  PSYCH: alert & oriented x 3, fluent speech NEURO: no focal motor/sensory deficits  LABORATORY DATA:  I have reviewed the data as listed    Latest Ref Rng & Units 07/19/2023   10:10 AM 01/07/2014    4:13 AM 01/06/2014    3:32 PM  CBC  WBC 4.0 - 10.5 K/uL 9.1  9.6  8.3   Hemoglobin 12.0 - 15.0 g/dL 86.5  78.4  69.6   Hematocrit 36.0 - 46.0 % 39.9  33.2  39.4   Platelets 150 - 400 K/uL 271  218  272        Latest Ref Rng & Units 07/19/2023   10:10 AM 01/07/2014    4:13 AM 01/06/2014    3:32 PM  CMP  Glucose 70 - 99 mg/dL 75  295  284   BUN 8 - 23 mg/dL 20  11  17    Creatinine 0.44 - 1.00 mg/dL 1.32  4.40  1.02   Sodium 135 - 145 mmol/L 127  132  133   Potassium 3.5 - 5.1 mmol/L 3.7  3.9  3.3   Chloride 98 - 111 mmol/L 91  95  94   CO2 22 - 32 mmol/L 29  25  22    Calcium 8.9 - 10.3 mg/dL 9.4  8.6  9.2   Total Protein 6.5 - 8.1 g/dL 7.3   7.2   Total Bilirubin 0.3 - 1.2 mg/dL 0.7   0.3   Alkaline Phos 38 - 126 U/L 70   90   AST 15 - 41 U/L 21   28   ALT 0 - 44 U/L 14   18     Lab Results  Component Value Date   MPROTEIN Not Observed 07/19/2023   Lab Results  Component Value Date   KPAFRELGTCHN 13.6 07/19/2023   LAMBDASER 13.0 07/19/2023  KAPLAMBRATIO 5.61 07/26/2023    KAPLAMBRATIO 1.05 07/19/2023    RADIOGRAPHIC STUDIES: I have personally reviewed the radiological images as listed and agreed with the findings in the report. No results found.  ASSESSMENT & PLAN [name] 62 y.o. 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  Monoclonal Gammopathies are a group of medical conditions defined by the presence of a monoclonal protein (an M protein) in the blood or urine. Monoclonal gammopathies include monoclonal gammopathy of unknown significance (MGUS), Monoclonal gammopathies of renal or neurological significance,  smoldering multiple myeloma (SMM), multiple myeloma (MM), AL amyloidosis, and Waldenstrom macroglobulinemia. The goal of the initial workup is to determine which monoclonal gammopathy a patient has. The workup consists of evaluating protein in the serum (with serum protein electrophoresis (SPEP) and serum free light chains) , evaluating protein in the urine (UPEP), and evaluation of the skeleton (DG Bone Met Survey) to assure no lytic lesions. Baseline bloodwork includes CMP and CBC. If no CRAB criteria or high risk criteria are noted then the diagnosis is MGUS. MGUS must be followed with bloodwork periodically to assure it does not convert to multiple myeloma (occurs to approximately 1% of patients per year). If there are CRAB criteria or high risk features (such as elevated serum free light chain ratio (taking into account renal function), a non IgG M protein, or M protein >1.5) then a bone marrow biopsy must be pursued.    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  There is no evidence of an M protein in the urine or serum.  Serum free light chains are within normal limits.  Would recommend repeat testing in 6 months to confirm the patient does not have an MGUS. --at next visit will order an SPEP, SFLC to  see if M protein is detectable.  --additionally will collect new baseline CBC, CMP, and LDH --awaiting results of metastatic bone survey assessing for lytic lesions --will consider the need for a bone marrow biopsy pending the above results --RTC in 6 months with repeat labs 1 week before.   No orders of the defined types were placed in this encounter.   All questions were answered. The patient knows to call the clinic with any problems, questions or concerns.  A total of more than 30 minutes were spent on this encounter with face-to-face time and non-face-to-face time, including preparing to see the patient, ordering tests and/or medications, counseling the patient and coordination of care as outlined above.   Ulysees Barns, MD Department of Hematology/Oncology [facility] Phone: [phone] Pager: [phone] Email: [email or web address]  07/30/2023 1:50 PM

## 2023-08-06 ENCOUNTER — Ambulatory Visit: Payer: BC Managed Care – PPO | Admitting: Cardiovascular Disease

## 2023-08-17 ENCOUNTER — Telehealth: Payer: Self-pay

## 2023-08-17 NOTE — Telephone Encounter (Signed)
This RN called pt to relay the below results. Pt verbalized understanding.

## 2023-08-17 NOTE — Telephone Encounter (Signed)
-----   Message from Ulysees Barns IV sent at 08/16/2023  3:44 PM EDT ----- Please let Ms. Devine know that her metastatic survey did not show any evidence of lytic lesions.  Additionally her urine did not show any concerning elevations and abnormal protein.  We will plan to see her back in March 2025 to reassess. ----- Message ----- From: Interface, Rad Results In Sent: 08/14/2023   2:11 PM EDT To: Jaci Standard, MD

## 2023-09-09 ENCOUNTER — Other Ambulatory Visit: Payer: Self-pay | Admitting: Internal Medicine

## 2023-09-09 DIAGNOSIS — Z1231 Encounter for screening mammogram for malignant neoplasm of breast: Secondary | ICD-10-CM

## 2023-09-13 DIAGNOSIS — E612 Magnesium deficiency: Secondary | ICD-10-CM | POA: Diagnosis not present

## 2023-09-13 DIAGNOSIS — I10 Essential (primary) hypertension: Secondary | ICD-10-CM | POA: Diagnosis not present

## 2023-09-13 DIAGNOSIS — N951 Menopausal and female climacteric states: Secondary | ICD-10-CM | POA: Diagnosis not present

## 2023-09-13 DIAGNOSIS — R748 Abnormal levels of other serum enzymes: Secondary | ICD-10-CM | POA: Diagnosis not present

## 2023-11-15 ENCOUNTER — Other Ambulatory Visit (HOSPITAL_COMMUNITY): Payer: Self-pay | Admitting: Internal Medicine

## 2023-11-15 DIAGNOSIS — I10 Essential (primary) hypertension: Secondary | ICD-10-CM | POA: Diagnosis not present

## 2023-11-15 DIAGNOSIS — Z79899 Other long term (current) drug therapy: Secondary | ICD-10-CM | POA: Diagnosis not present

## 2023-11-15 DIAGNOSIS — E7800 Pure hypercholesterolemia, unspecified: Secondary | ICD-10-CM | POA: Diagnosis not present

## 2023-11-15 DIAGNOSIS — Z0000 Encounter for general adult medical examination without abnormal findings: Secondary | ICD-10-CM | POA: Diagnosis not present

## 2023-11-15 DIAGNOSIS — J309 Allergic rhinitis, unspecified: Secondary | ICD-10-CM | POA: Diagnosis not present

## 2023-11-16 DIAGNOSIS — M25462 Effusion, left knee: Secondary | ICD-10-CM | POA: Diagnosis not present

## 2023-11-16 DIAGNOSIS — M25562 Pain in left knee: Secondary | ICD-10-CM | POA: Diagnosis not present

## 2023-11-16 DIAGNOSIS — R531 Weakness: Secondary | ICD-10-CM | POA: Diagnosis not present

## 2023-11-16 DIAGNOSIS — M25662 Stiffness of left knee, not elsewhere classified: Secondary | ICD-10-CM | POA: Diagnosis not present

## 2023-11-24 DIAGNOSIS — M25562 Pain in left knee: Secondary | ICD-10-CM | POA: Diagnosis not present

## 2023-11-24 DIAGNOSIS — M25662 Stiffness of left knee, not elsewhere classified: Secondary | ICD-10-CM | POA: Diagnosis not present

## 2023-11-24 DIAGNOSIS — R531 Weakness: Secondary | ICD-10-CM | POA: Diagnosis not present

## 2023-11-24 DIAGNOSIS — M25462 Effusion, left knee: Secondary | ICD-10-CM | POA: Diagnosis not present

## 2023-11-26 ENCOUNTER — Other Ambulatory Visit (HOSPITAL_COMMUNITY): Payer: BC Managed Care – PPO

## 2023-12-01 ENCOUNTER — Ambulatory Visit (HOSPITAL_COMMUNITY)
Admission: RE | Admit: 2023-12-01 | Discharge: 2023-12-01 | Disposition: A | Discharge status: Home / Self Care | Payer: Self-pay | Source: Ambulatory Visit | Attending: Internal Medicine | Admitting: Internal Medicine

## 2023-12-01 DIAGNOSIS — E7800 Pure hypercholesterolemia, unspecified: Secondary | ICD-10-CM | POA: Insufficient documentation

## 2023-12-06 ENCOUNTER — Ambulatory Visit
Admission: RE | Admit: 2023-12-06 | Discharge: 2023-12-06 | Disposition: A | Discharge status: Home / Self Care | Payer: BC Managed Care – PPO | Source: Ambulatory Visit | Attending: Internal Medicine | Admitting: Internal Medicine

## 2023-12-06 DIAGNOSIS — Z1231 Encounter for screening mammogram for malignant neoplasm of breast: Secondary | ICD-10-CM

## 2023-12-08 ENCOUNTER — Other Ambulatory Visit: Payer: Self-pay | Admitting: Internal Medicine

## 2023-12-08 DIAGNOSIS — M810 Age-related osteoporosis without current pathological fracture: Secondary | ICD-10-CM | POA: Diagnosis not present

## 2023-12-08 DIAGNOSIS — E7800 Pure hypercholesterolemia, unspecified: Secondary | ICD-10-CM | POA: Diagnosis not present

## 2023-12-08 DIAGNOSIS — R928 Other abnormal and inconclusive findings on diagnostic imaging of breast: Secondary | ICD-10-CM

## 2023-12-08 DIAGNOSIS — Z0000 Encounter for general adult medical examination without abnormal findings: Secondary | ICD-10-CM | POA: Diagnosis not present

## 2023-12-22 ENCOUNTER — Ambulatory Visit: Payer: BC Managed Care – PPO

## 2023-12-22 ENCOUNTER — Ambulatory Visit
Admission: RE | Admit: 2023-12-22 | Discharge: 2023-12-22 | Discharge status: Home / Self Care | Payer: BC Managed Care – PPO | Source: Ambulatory Visit | Attending: Internal Medicine | Admitting: Internal Medicine

## 2023-12-22 DIAGNOSIS — R928 Other abnormal and inconclusive findings on diagnostic imaging of breast: Secondary | ICD-10-CM | POA: Diagnosis not present

## 2023-12-23 ENCOUNTER — Other Ambulatory Visit: Payer: BC Managed Care – PPO

## 2023-12-30 DIAGNOSIS — I10 Essential (primary) hypertension: Secondary | ICD-10-CM | POA: Diagnosis not present

## 2023-12-30 DIAGNOSIS — M810 Age-related osteoporosis without current pathological fracture: Secondary | ICD-10-CM | POA: Diagnosis not present

## 2023-12-30 DIAGNOSIS — R748 Abnormal levels of other serum enzymes: Secondary | ICD-10-CM | POA: Diagnosis not present

## 2023-12-30 DIAGNOSIS — D519 Vitamin B12 deficiency anemia, unspecified: Secondary | ICD-10-CM | POA: Diagnosis not present

## 2023-12-30 DIAGNOSIS — N951 Menopausal and female climacteric states: Secondary | ICD-10-CM | POA: Diagnosis not present

## 2023-12-30 DIAGNOSIS — E559 Vitamin D deficiency, unspecified: Secondary | ICD-10-CM | POA: Diagnosis not present

## 2024-01-11 ENCOUNTER — Other Ambulatory Visit: Payer: Self-pay | Admitting: Hematology and Oncology

## 2024-01-11 ENCOUNTER — Inpatient Hospital Stay: Payer: BC Managed Care – PPO | Attending: Hematology and Oncology

## 2024-01-11 DIAGNOSIS — D472 Monoclonal gammopathy: Secondary | ICD-10-CM | POA: Insufficient documentation

## 2024-01-11 LAB — CMP (CANCER CENTER ONLY)
ALT: 14 U/L (ref 0–44)
AST: 22 U/L (ref 15–41)
Albumin: 4.3 g/dL (ref 3.5–5.0)
Alkaline Phosphatase: 56 U/L (ref 38–126)
Anion gap: 4 — ABNORMAL LOW (ref 5–15)
BUN: 17 mg/dL (ref 8–23)
CO2: 29 mmol/L (ref 22–32)
Calcium: 8.8 mg/dL — ABNORMAL LOW (ref 8.9–10.3)
Chloride: 97 mmol/L — ABNORMAL LOW (ref 98–111)
Creatinine: 0.83 mg/dL (ref 0.44–1.00)
GFR, Estimated: >60 mL/min (ref >60)
Glucose, Bld: 93 mg/dL (ref 70–99)
Potassium: 4.1 mmol/L (ref 3.5–5.1)
Sodium: 130 mmol/L — ABNORMAL LOW (ref 135–145)
Total Bilirubin: 0.6 mg/dL (ref 0.0–1.2)
Total Protein: 6.8 g/dL (ref 6.5–8.1)

## 2024-01-11 LAB — CBC WITH DIFFERENTIAL (CANCER CENTER ONLY)
Abs Immature Granulocytes: 0.04 10*3/uL (ref 0.00–0.07)
Basophils Absolute: 0.1 10*3/uL (ref 0.0–0.1)
Basophils Relative: 1 %
Eosinophils Absolute: 0.1 10*3/uL (ref 0.0–0.5)
Eosinophils Relative: 1 %
HCT: 36.8 % (ref 36.0–46.0)
Hemoglobin: 12.5 g/dL (ref 12.0–15.0)
Immature Granulocytes: 1 %
Lymphocytes Relative: 20 %
Lymphs Abs: 1.6 10*3/uL (ref 0.7–4.0)
MCH: 29.7 pg (ref 26.0–34.0)
MCHC: 34 g/dL (ref 30.0–36.0)
MCV: 87.4 fL (ref 80.0–100.0)
Monocytes Absolute: 0.5 10*3/uL (ref 0.1–1.0)
Monocytes Relative: 6 %
Neutro Abs: 5.9 10*3/uL (ref 1.7–7.7)
Neutrophils Relative %: 71 %
Platelet Count: 231 10*3/uL (ref 150–400)
RBC: 4.21 MIL/uL (ref 3.87–5.11)
RDW: 13 % (ref 11.5–15.5)
WBC Count: 8.1 10*3/uL (ref 4.0–10.5)
nRBC: 0 % (ref 0.0–0.2)

## 2024-01-11 LAB — LACTATE DEHYDROGENASE: LDH: 138 U/L (ref 98–192)

## 2024-01-12 LAB — KAPPA/LAMBDA LIGHT CHAINS
Kappa free light chain: 12.6 mg/L (ref 3.3–19.4)
Kappa, lambda light chain ratio: 0.92 (ref 0.26–1.65)
Lambda free light chains: 13.7 mg/L (ref 5.7–26.3)

## 2024-01-14 LAB — MULTIPLE MYELOMA PANEL, SERUM
Albumin SerPl Elph-Mcnc: 3.8 g/dL (ref 2.9–4.4)
Albumin/Glob SerPl: 1.5 (ref 0.7–1.7)
Alpha 1: 0.2 g/dL (ref 0.0–0.4)
Alpha2 Glob SerPl Elph-Mcnc: 0.5 g/dL (ref 0.4–1.0)
B-Globulin SerPl Elph-Mcnc: 0.9 g/dL (ref 0.7–1.3)
Gamma Glob SerPl Elph-Mcnc: 1 g/dL (ref 0.4–1.8)
Globulin, Total: 2.6 g/dL (ref 2.2–3.9)
IgA: 388 mg/dL — ABNORMAL HIGH (ref 87–352)
IgG (Immunoglobin G), Serum: 953 mg/dL (ref 586–1602)
IgM (Immunoglobulin M), Srm: 102 mg/dL (ref 26–217)
Total Protein ELP: 6.4 g/dL (ref 6.0–8.5)

## 2024-01-18 DIAGNOSIS — N951 Menopausal and female climacteric states: Secondary | ICD-10-CM | POA: Diagnosis not present

## 2024-01-18 DIAGNOSIS — R748 Abnormal levels of other serum enzymes: Secondary | ICD-10-CM | POA: Diagnosis not present

## 2024-01-18 DIAGNOSIS — E612 Magnesium deficiency: Secondary | ICD-10-CM | POA: Diagnosis not present

## 2024-01-18 DIAGNOSIS — I10 Essential (primary) hypertension: Secondary | ICD-10-CM | POA: Diagnosis not present

## 2024-01-19 ENCOUNTER — Inpatient Hospital Stay (HOSPITAL_BASED_OUTPATIENT_CLINIC_OR_DEPARTMENT_OTHER): Payer: BC Managed Care – PPO | Admitting: Hematology and Oncology

## 2024-01-19 VITALS — BP 140/92 | HR 62 | Temp 98.2°F | Resp 13 | Wt 124.5 lb

## 2024-01-19 DIAGNOSIS — D472 Monoclonal gammopathy: Secondary | ICD-10-CM

## 2024-01-19 NOTE — Progress Notes (Signed)
 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 Mosetta Putt. MM panel showed no M protein, SFLC kappa 13.6, lambda 13, ratio 1.05. UPEP negative for M protein 07/30/2023: transfer care to Dr. Leonides Schanz   Interval History:  [name] 63 y.o. 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 The patient's last visit was on 07/30/2023. In the interim since the last visit her MGUS labs returned with no clear evidence of an M protein x 2.  On exam today [name] is accompanied by her husband.  She reports she has been well overall in the interim since her last visit.  She reports her health is excellent though she did recently start a statin and aspirin due to concern for her calcium score.  She reports that she also stopped taking her hydrochlorothiazide.  She reports no infectious symptoms such as runny nose, sore throat, or cough.  She denies any fevers, chills, sweats.  She reports that she is otherwise at her baseline level of health and has no questions concerns or complaints today.  A full 10 point ROS is otherwise negative.  The bulk of our discussion focused on the results of her blood work and the plan moving forward.  Details of this conversation are noted below.  MEDICAL HISTORY:  Past Medical History:  Diagnosis Date   Hypertension    Mitral valve prolapse    Osteoporosis     SURGICAL HISTORY: Past Surgical History:  Procedure Laterality Date   BREAST BIOPSY Left    CHEST TUBE INSERTION  01/06/2014   REDUCTION MAMMAPLASTY Bilateral     SOCIAL HISTORY: Social History   Socioeconomic History   Marital status: Married    Spouse name: Not on file   Number of  children: 0   Years of education: Not on file   Highest education level: Not on file  Occupational History   Not on file  Tobacco Use   Smoking status: Never   Smokeless tobacco: Never  Substance and Sexual Activity   Alcohol use: No   Drug use: No   Sexual activity: Not on file  Other Topics Concern   Not on file  Social History Narrative   Not on file   Social Drivers of Health   Financial Resource Strain: Not on file  Food Insecurity: No Food Insecurity (07/03/2023)   Hunger Vital Sign    Worried About Running Out of Food in the Last Year: Never true    Ran Out of Food in the Last Year: Never true  Transportation Needs: No Transportation Needs (07/03/2023)   PRAPARE - Administrator, Civil Service (Medical): No    Lack of Transportation (Non-Medical): No  Physical Activity: Not on file  Stress: Not on file  Social Connections: Not on file  Intimate Partner Violence: Not At Risk (07/03/2023)   Humiliation, Afraid, Rape, and Kick questionnaire    Fear of Current or Ex-Partner: No    Emotionally Abused: No    Physically Abused: No    Sexually Abused: No    FAMILY HISTORY: Family History  Problem Relation Age of Onset   Cancer Father 28       COLON CANCER   Cancer  Paternal Uncle 44       colon cancer   Cancer Cousin 35       colon cancer    ALLERGIES:  is allergic to doxycycline hyclate and lisinopril.  MEDICATIONS:  Current Outpatient Medications  Medication Sig Dispense Refill   acetaminophen (TYLENOL) 325 MG tablet Take 650 mg by mouth every 6 (six) hours as needed.     progesterone (PROMETRIUM) 100 MG capsule Take 100 mg by mouth daily.     Rosuvastatin Calcium 5 MG CPSP Take 5 mg by mouth daily.     doxycycline (MONODOX) 100 MG capsule Take 100 mg by mouth 2 (two) times daily.     telmisartan (MICARDIS) 80 MG tablet Take 80 mg by mouth daily.     No current facility-administered medications for this visit.    REVIEW OF SYSTEMS:    Constitutional: ( - ) fevers, ( - )  chills , ( - ) night sweats Eyes: ( - ) blurriness of vision, ( - ) double vision, ( - ) watery eyes Ears, nose, mouth, throat, and face: ( - ) mucositis, ( - ) sore throat Respiratory: ( - ) cough, ( - ) dyspnea, ( - ) wheezes Cardiovascular: ( - ) palpitation, ( - ) chest discomfort, ( - ) lower extremity swelling Gastrointestinal:  ( - ) nausea, ( - ) heartburn, ( - ) change in bowel habits Skin: ( - ) abnormal skin rashes Lymphatics: ( - ) new lymphadenopathy, ( - ) easy bruising Neurological: ( - ) numbness, ( - ) tingling, ( - ) new weaknesses Behavioral/Psych: ( - ) mood change, ( - ) new changes  All other systems were reviewed with the patient and are negative.  PHYSICAL EXAMINATION: Vitals:   01/19/24 1105  BP: (!) 140/92  Pulse: 62  Resp: 13  Temp: 98.2 F (36.8 C)  SpO2: 100%    Filed Weights   01/19/24 1105  Weight: 124 lb 8 oz (56.5 kg)     GENERAL: Well-appearing middle-age Caucasian female, alert, no distress and comfortable SKIN: skin color, texture, turgor are normal, no rashes or significant lesions EYES: conjunctiva are pink and non-injected, sclera clear LUNGS: clear to auscultation and percussion with normal breathing effort HEART: regular rate & rhythm and no murmurs and no lower extremity edema Musculoskeletal: no cyanosis of digits and no clubbing  PSYCH: alert & oriented x 3, fluent speech NEURO: no focal motor/sensory deficits  LABORATORY DATA:  I have reviewed the data as listed    Latest Ref Rng & Units 01/11/2024   10:24 AM 07/19/2023   10:10 AM 01/07/2014    4:13 AM  CBC  WBC 4.0 - 10.5 K/uL 8.1  9.1  9.6   Hemoglobin 12.0 - 15.0 g/dL 16.1  09.6  04.5   Hematocrit 36.0 - 46.0 % 36.8  39.9  33.2   Platelets 150 - 400 K/uL 231  271  218        Latest Ref Rng & Units 01/11/2024   10:24 AM 07/19/2023   10:10 AM 01/07/2014    4:13 AM  CMP  Glucose 70 - 99 mg/dL 93  75  409   BUN 8 - 23 mg/dL 17  20  11     Creatinine 0.44 - 1.00 mg/dL 8.11  9.14  7.82   Sodium 135 - 145 mmol/L 130  127  132   Potassium 3.5 - 5.1 mmol/L 4.1  3.7  3.9   Chloride 98 - 111  mmol/L 97  91  95   CO2 22 - 32 mmol/L 29  29  25    Calcium 8.9 - 10.3 mg/dL 8.8  9.4  8.6   Total Protein 6.5 - 8.1 g/dL 6.8  7.3    Total Bilirubin 0.0 - 1.2 mg/dL 0.6  0.7    Alkaline Phos 38 - 126 U/L 56  70    AST 15 - 41 U/L 22  21    ALT 0 - 44 U/L 14  14      Lab Results  Component Value Date   MPROTEIN Not Observed 01/11/2024   MPROTEIN Not Observed 07/19/2023   Lab Results  Component Value Date   KPAFRELGTCHN 12.6 01/11/2024   KPAFRELGTCHN 13.6 07/19/2023   LAMBDASER 13.7 01/11/2024   LAMBDASER 13.0 07/19/2023   KAPLAMBRATIO 0.92 01/11/2024   KAPLAMBRATIO 5.61 07/26/2023   KAPLAMBRATIO 1.05 07/19/2023    RADIOGRAPHIC STUDIES: I have personally reviewed the radiological images as listed and agreed with the findings in the report. MM 3D DIAGNOSTIC MAMMOGRAM BILATERAL BREAST Result Date: 12/22/2023 CLINICAL DATA:  Screening recall for possible bilateral breast asymmetries. Patient has been placed on hormone replacement therapy for osteoporosis since her prior mammograms. EXAM: DIGITAL DIAGNOSTIC BILATERAL MAMMOGRAM WITH TOMOSYNTHESIS AND CAD; ULTRASOUND RIGHT BREAST LIMITED TECHNIQUE: Bilateral digital diagnostic mammography and breast tomosynthesis was performed. The images were evaluated with computer-aided detection. ; Targeted ultrasound examination of the right breast was performed COMPARISON:  Previous exam(s). ACR Breast Density Category b: There are scattered areas of fibroglandular density. FINDINGS: On spot compression imaging, the area of asymmetry in the lower outer right breast mostly disperses, demonstrating interspersed fat attenuation ill-defined margins consistent with fibroglandular tissue. There is no underlying mass or distortion. On the left, the possible asymmetry noted on the screening exam disperses  consistent with superimposed fibroglandular tissue. There is no underlying mass or distortion. On physical exam, no mass is palpated along the lower inner right breast. Targeted ultrasound is performed, showing normal fibroglandular tissue throughout the lower inner right breast in the area the possible screening asymmetry. No mass or suspicious finding. IMPRESSION: 1. No evidence of breast malignancy. 2. Areas of asymmetry noted on the current screening exam are due to fibroglandular tissue stimulated by the recent hormone replacement therapy. RECOMMENDATION: Screening mammogram in one year.(Code:SM-B-01Y) I have discussed the findings and recommendations with the patient. If applicable, a reminder letter will be sent to the patient regarding the next appointment. BI-RADS CATEGORY  1: Negative. Electronically Signed   By: Amie Portland M.D.   On: 12/22/2023 07:36   [place] LIMITED ULTRASOUND INCLUDING AXILLA RIGHT BREAST Result Date: 12/22/2023 CLINICAL DATA:  Screening recall for possible bilateral breast asymmetries. Patient has been placed on hormone replacement therapy for osteoporosis since her prior mammograms. EXAM: DIGITAL DIAGNOSTIC BILATERAL MAMMOGRAM WITH TOMOSYNTHESIS AND CAD; ULTRASOUND RIGHT BREAST LIMITED TECHNIQUE: Bilateral digital diagnostic mammography and breast tomosynthesis was performed. The images were evaluated with computer-aided detection. ; Targeted ultrasound examination of the right breast was performed COMPARISON:  Previous exam(s). ACR Breast Density Category b: There are scattered areas of fibroglandular density. FINDINGS: On spot compression imaging, the area of asymmetry in the lower outer right breast mostly disperses, demonstrating interspersed fat attenuation ill-defined margins consistent with fibroglandular tissue. There is no underlying mass or distortion. On the left, the possible asymmetry noted on the screening exam disperses consistent with superimposed fibroglandular  tissue. There is no underlying mass or distortion. On physical exam, no mass is palpated along  the lower inner right breast. Targeted ultrasound is performed, showing normal fibroglandular tissue throughout the lower inner right breast in the area the possible screening asymmetry. No mass or suspicious finding. IMPRESSION: 1. No evidence of breast malignancy. 2. Areas of asymmetry noted on the current screening exam are due to fibroglandular tissue stimulated by the recent hormone replacement therapy. RECOMMENDATION: Screening mammogram in one year.(Code:SM-B-01Y) I have discussed the findings and recommendations with the patient. If applicable, a reminder letter will be sent to the patient regarding the next appointment. BI-RADS CATEGORY  1: Negative. Electronically Signed   By: Amie Portland M.D.   On: 12/22/2023 07:36    ASSESSMENT & PLAN KACIE HUXTABLE 63 y.o. 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  Monoclonal Gammopathies are a group of medical conditions defined by the presence of a monoclonal protein (an M protein) in the blood or urine. Monoclonal gammopathies include monoclonal gammopathy of unknown significance (MGUS), Monoclonal gammopathies of renal or neurological significance,  smoldering multiple myeloma (SMM), multiple myeloma (MM), AL amyloidosis, and Waldenstrom macroglobulinemia. The goal of the initial workup is to determine which monoclonal gammopathy a patient has. The workup consists of evaluating protein in the serum (with serum protein electrophoresis (SPEP) and serum free light chains) , evaluating protein in the urine (UPEP), and evaluation of the skeleton (DG Bone Met Survey) to assure no lytic lesions. Baseline bloodwork includes CMP and CBC. If no CRAB criteria or high risk criteria are noted then the diagnosis is MGUS. MGUS must be followed with bloodwork periodically to assure it does not convert to multiple myeloma (occurs  to approximately 1% of patients per year). If there are CRAB criteria or high risk features (such as elevated serum free light chain ratio (taking into account renal function), a non IgG M protein, or M protein >1.5) then a bone marrow biopsy must be pursued.    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  There is no evidence of an M protein in the urine or serum x 2 .  Serum free light chains are within normal limits.   --no evidence of MGUS based on 2 sets of labs --metastatic bone survey shows no lytic lesions --labs today show white blood cell 8.1, hemoglobin 12.5, MCV 87.4, platelets 231.  Creatinine and LFTs within normal limits. --RTC PRN.  No evidence of a monoclonal gammopathy  No orders of the defined types were placed in this encounter.   All questions were answered. The patient knows to call the clinic with any problems, questions or concerns.  A total of more than 25 minutes were spent on this encounter with face-to-face time and non-face-to-face time, including preparing to see the patient, ordering tests and/or medications, counseling the patient and coordination of care as outlined above.   Ulysees Barns, MD Department of Hematology/Oncology [facility] Phone: [phone] Pager: [phone] Email: [email or web address]  01/19/2024 1:24 PM

## 2024-01-27 DIAGNOSIS — H2511 Age-related nuclear cataract, right eye: Secondary | ICD-10-CM | POA: Diagnosis not present

## 2024-01-27 DIAGNOSIS — H25812 Combined forms of age-related cataract, left eye: Secondary | ICD-10-CM | POA: Diagnosis not present

## 2024-01-27 DIAGNOSIS — H43813 Vitreous degeneration, bilateral: Secondary | ICD-10-CM | POA: Diagnosis not present

## 2024-01-27 DIAGNOSIS — H35033 Hypertensive retinopathy, bilateral: Secondary | ICD-10-CM | POA: Diagnosis not present

## 2024-01-28 DIAGNOSIS — Z79899 Other long term (current) drug therapy: Secondary | ICD-10-CM | POA: Diagnosis not present

## 2024-02-29 ENCOUNTER — Ambulatory Visit: Admitting: Family Medicine

## 2024-03-07 ENCOUNTER — Ambulatory Visit: Admitting: Family Medicine

## 2024-03-16 DIAGNOSIS — M25462 Effusion, left knee: Secondary | ICD-10-CM | POA: Diagnosis not present

## 2024-03-16 DIAGNOSIS — M25562 Pain in left knee: Secondary | ICD-10-CM | POA: Diagnosis not present

## 2024-03-16 DIAGNOSIS — G8929 Other chronic pain: Secondary | ICD-10-CM | POA: Diagnosis not present

## 2024-05-08 DIAGNOSIS — Z13820 Encounter for screening for osteoporosis: Secondary | ICD-10-CM | POA: Diagnosis not present

## 2024-05-08 DIAGNOSIS — Z780 Asymptomatic menopausal state: Secondary | ICD-10-CM | POA: Diagnosis not present

## 2024-05-11 DIAGNOSIS — F4321 Adjustment disorder with depressed mood: Secondary | ICD-10-CM | POA: Diagnosis not present

## 2024-09-26 DIAGNOSIS — H25012 Cortical age-related cataract, left eye: Secondary | ICD-10-CM | POA: Diagnosis not present

## 2024-09-26 DIAGNOSIS — H2513 Age-related nuclear cataract, bilateral: Secondary | ICD-10-CM | POA: Diagnosis not present

## 2024-09-26 DIAGNOSIS — H43813 Vitreous degeneration, bilateral: Secondary | ICD-10-CM | POA: Diagnosis not present

## 2024-10-30 ENCOUNTER — Other Ambulatory Visit: Payer: Self-pay | Admitting: Internal Medicine

## 2024-10-30 DIAGNOSIS — Z1231 Encounter for screening mammogram for malignant neoplasm of breast: Secondary | ICD-10-CM

## 2024-12-06 ENCOUNTER — Ambulatory Visit

## 2024-12-20 ENCOUNTER — Ambulatory Visit
